# Patient Record
Sex: Male | Born: 1960 | Race: White | Hispanic: No | Marital: Single | Smoking: Current every day smoker
Health system: Southern US, Community
[De-identification: ages and names within clinical notes are randomized; demographics above are authoritative.]

## PROBLEM LIST (undated history)

## (undated) DIAGNOSIS — D649 Anemia, unspecified: Secondary | ICD-10-CM

## (undated) DIAGNOSIS — J449 Chronic obstructive pulmonary disease, unspecified: Secondary | ICD-10-CM

## (undated) DIAGNOSIS — I10 Essential (primary) hypertension: Secondary | ICD-10-CM

## (undated) DIAGNOSIS — N289 Disorder of kidney and ureter, unspecified: Secondary | ICD-10-CM

## (undated) DIAGNOSIS — K219 Gastro-esophageal reflux disease without esophagitis: Secondary | ICD-10-CM

## (undated) DIAGNOSIS — I2699 Other pulmonary embolism without acute cor pulmonale: Secondary | ICD-10-CM

## (undated) HISTORY — PX: OTHER SURGICAL HISTORY: SHX169

## (undated) HISTORY — DX: Gastro-esophageal reflux disease without esophagitis: K21.9

---

## 2007-03-30 ENCOUNTER — Ambulatory Visit: Payer: Self-pay | Admitting: Internal Medicine

## 2007-04-20 ENCOUNTER — Other Ambulatory Visit: Payer: Self-pay

## 2007-04-21 ENCOUNTER — Inpatient Hospital Stay: Payer: Self-pay | Admitting: Internal Medicine

## 2007-04-26 ENCOUNTER — Ambulatory Visit: Payer: Self-pay | Admitting: Internal Medicine

## 2007-04-30 ENCOUNTER — Ambulatory Visit: Payer: Self-pay | Admitting: Internal Medicine

## 2007-05-30 ENCOUNTER — Ambulatory Visit: Payer: Self-pay | Admitting: Internal Medicine

## 2007-06-30 ENCOUNTER — Ambulatory Visit: Payer: Self-pay | Admitting: Internal Medicine

## 2007-12-21 ENCOUNTER — Ambulatory Visit: Payer: Self-pay | Admitting: Nephrology

## 2008-02-01 ENCOUNTER — Ambulatory Visit: Payer: Self-pay | Admitting: Gastroenterology

## 2008-03-27 ENCOUNTER — Ambulatory Visit: Payer: Self-pay | Admitting: Gastroenterology

## 2010-05-12 ENCOUNTER — Emergency Department: Payer: Self-pay | Admitting: Emergency Medicine

## 2013-03-16 ENCOUNTER — Ambulatory Visit: Payer: Self-pay | Admitting: Adult Health

## 2013-04-11 ENCOUNTER — Ambulatory Visit: Payer: Self-pay

## 2013-04-18 ENCOUNTER — Ambulatory Visit: Payer: Self-pay

## 2014-01-03 LAB — LIPID PANEL
Cholesterol: 256 mg/dL — AB (ref 0–200)
HDL: 85 mg/dL — AB (ref 35–70)
LDL CALC: 141 mg/dL
TRIGLYCERIDES: 151 mg/dL (ref 40–160)

## 2014-06-27 LAB — TSH: TSH: 0.85 u[IU]/mL (ref 0.41–5.90)

## 2014-06-27 LAB — HEMOGLOBIN A1C: HEMOGLOBIN A1C: 5.6

## 2014-07-12 ENCOUNTER — Ambulatory Visit: Payer: Self-pay | Admitting: Internal Medicine

## 2014-12-17 IMAGING — US US RENAL KIDNEY
1 series · 14 of 25 positions shown · non-contrast
Comparison: none

REASON FOR EXAM: hist Nephrotic syndrome eval for size and shape
COMMENTS:

[Series 1: us renal kidney · 0.26mm/px · 14 of 53 slices shown]
[im 1/53]
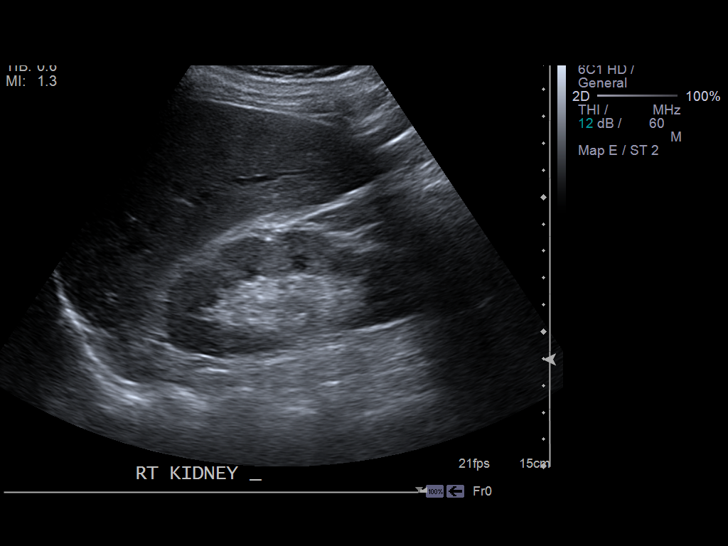
[im 5/53]
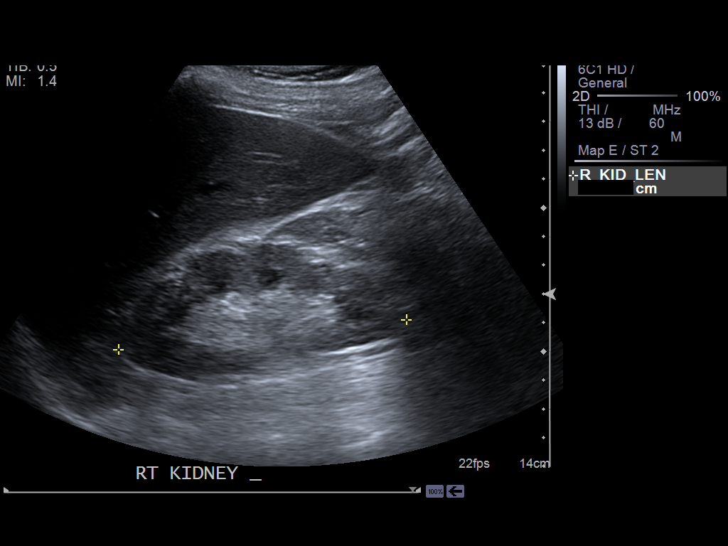
[im 9/53]
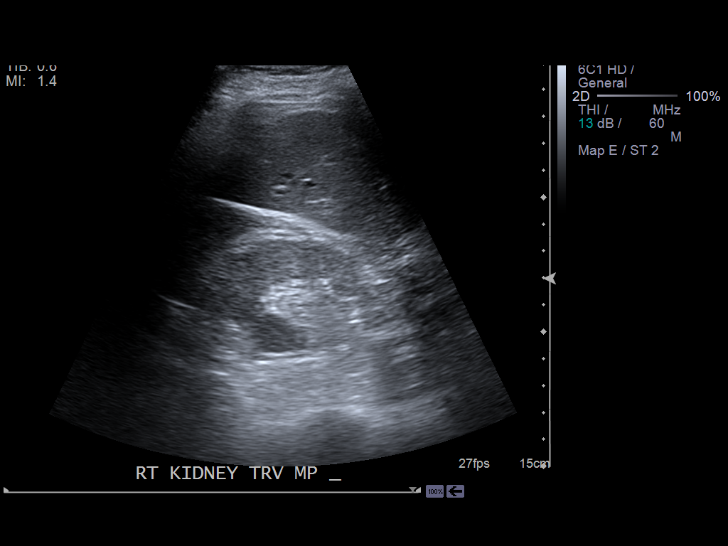
[im 14/53]
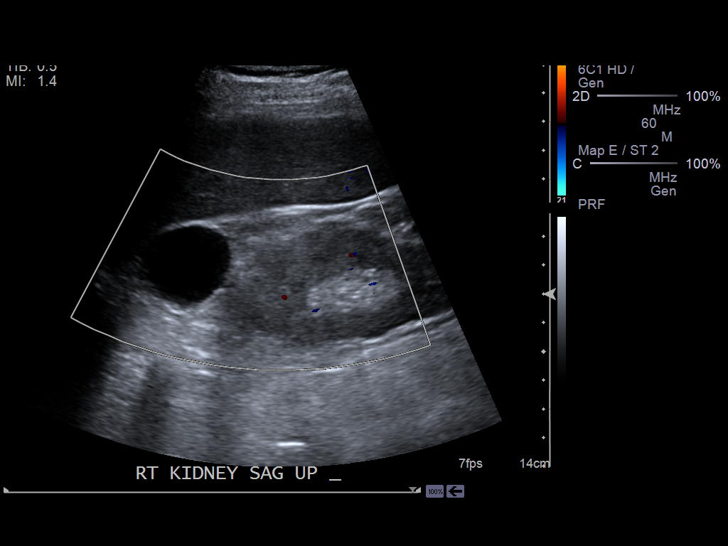
[im 18/53]
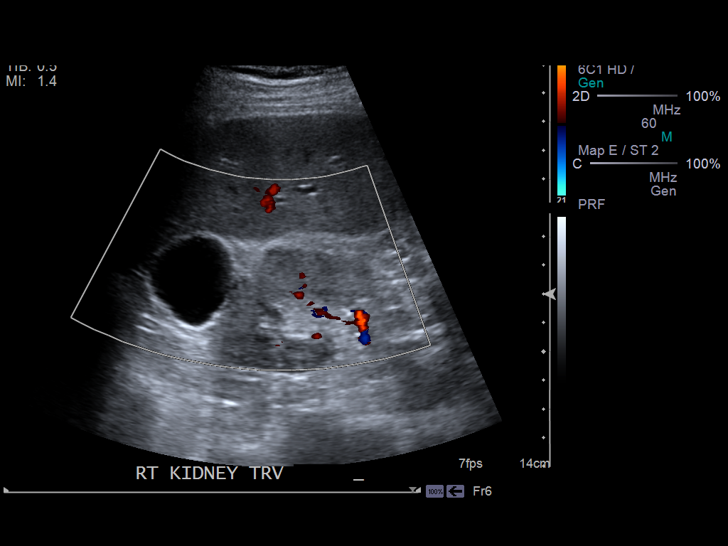
[im 20/53]
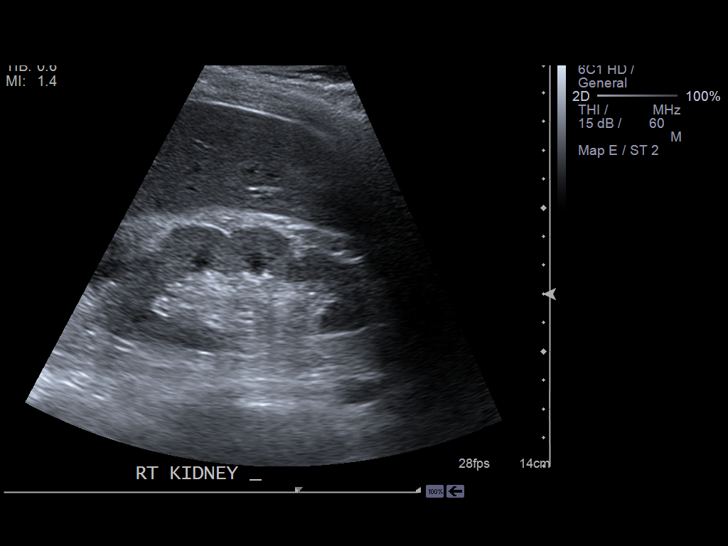
[im 24/53]
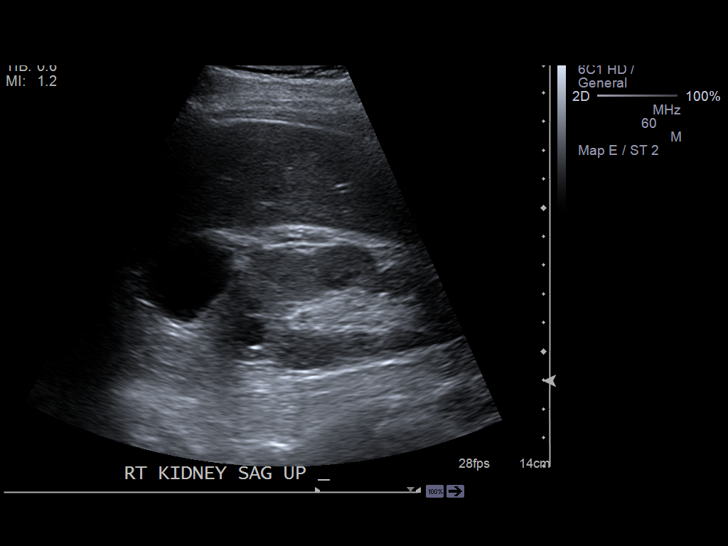
[im 29/53]
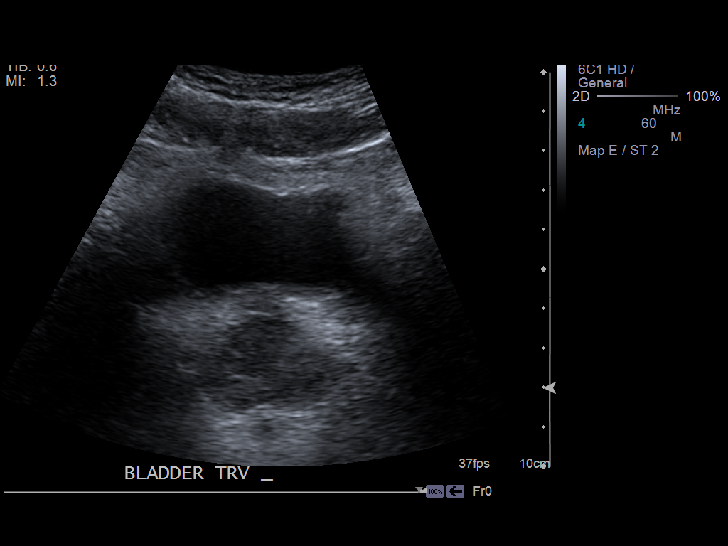
[im 33/53]
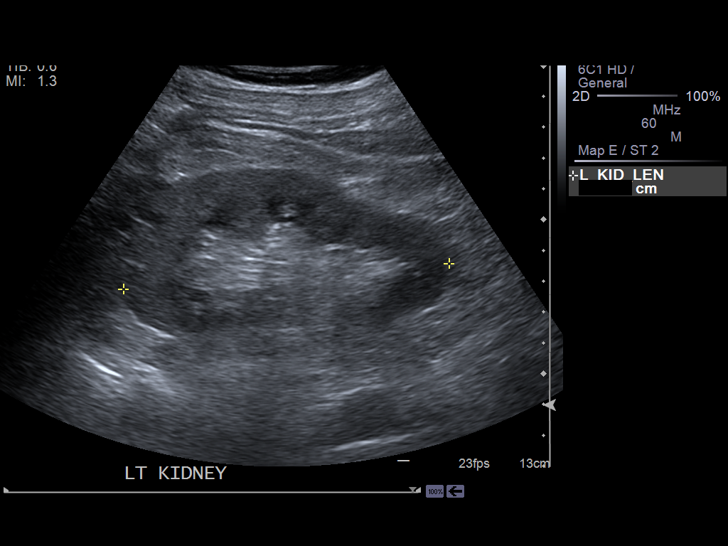
[im 35/53]
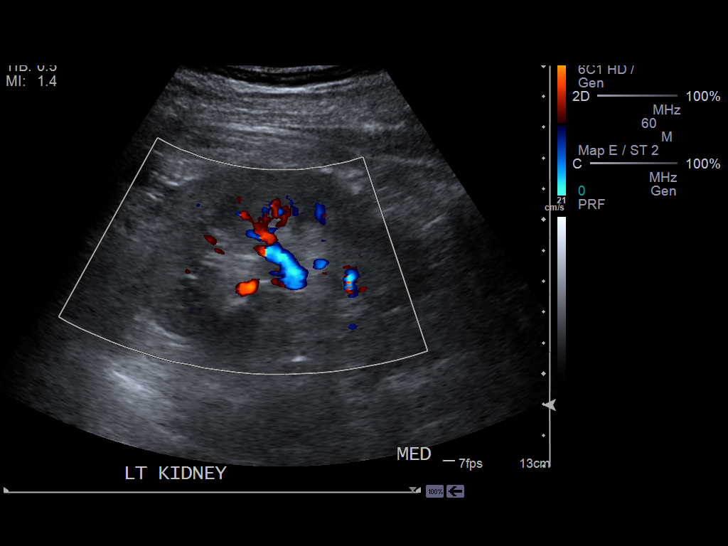
[im 40/53]
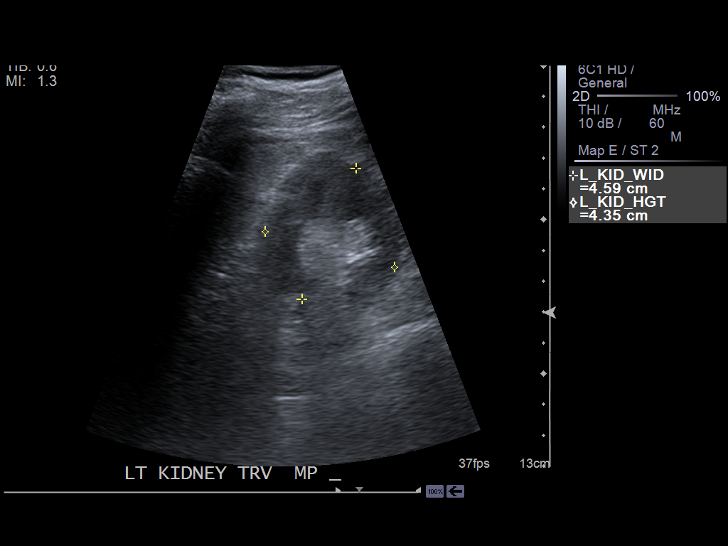
[im 44/53]
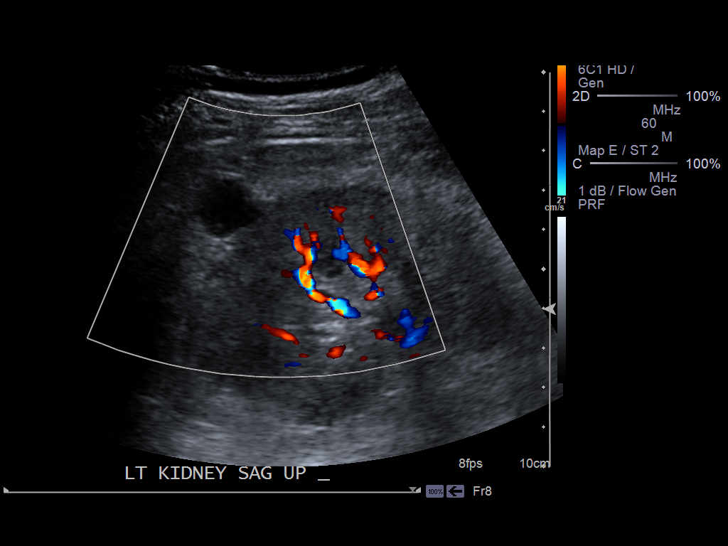
[im 48/53]
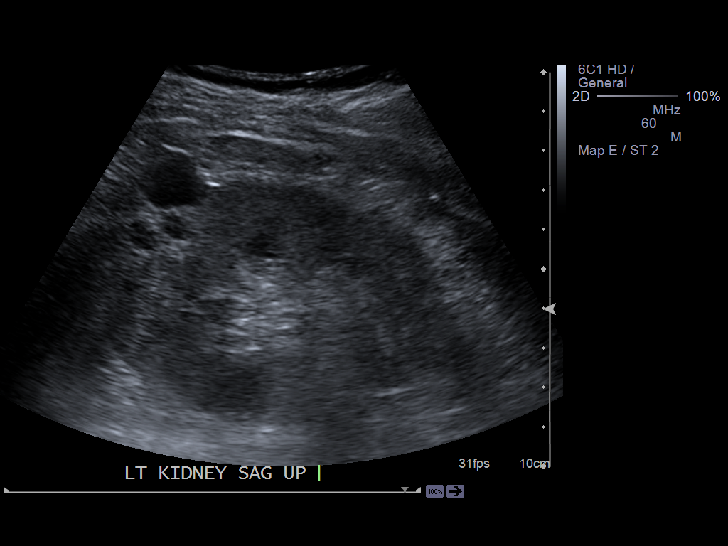
[im 53/53]
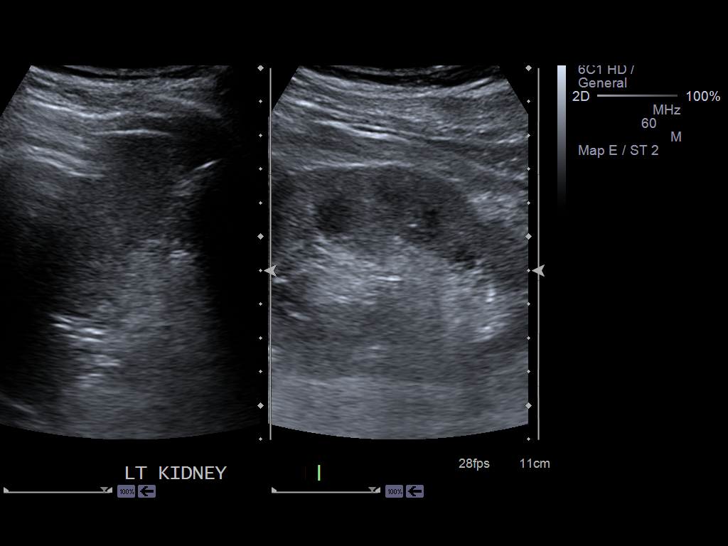

[14 of 25 positions shown; findings below may reference images not displayed]

PROCEDURE:     US  - US KIDNEY  - March 16, 2013 [DATE]

RESULT:     The kidneys are normal and contour. The right kidney measures
10.1 x 4.1 x 4.4 cm. The left kidney measures 10.6 x 4.6 x 4.4 cm. The
echotexture of the renal cortex on the right is slightly greater than that
of the adjacent liver suggesting medical renal disease. There is no
hydronephrosis. There are multiple cysts present. On the right the largest
cyst is associated with the upper pole and measures 3.6 x 2.7 x 3.1 cm. On
the left the largest cyst is also in the upper pole and measures 1.5 x 1.6 x
1.5 cm.

The urinary bladder is only partially distended.
IMPRESSION: 1. The kidneys appear normal in size and reniform in contour. There are
simple appearing cysts in both kidneys.
2. The echotexture the renal cortex on the right is slightly greater than
that of the adjacent liver consistent with medical renal disease.

[REDACTED]

## 2015-01-23 ENCOUNTER — Inpatient Hospital Stay
Admission: EM | Admit: 2015-01-23 | Discharge: 2015-01-24 | DRG: 683 | Disposition: A | Discharge status: Home / Self Care | Payer: Self-pay | Attending: Internal Medicine | Admitting: Internal Medicine

## 2015-01-23 ENCOUNTER — Inpatient Hospital Stay: Payer: Self-pay

## 2015-01-23 DIAGNOSIS — I10 Essential (primary) hypertension: Secondary | ICD-10-CM | POA: Diagnosis present

## 2015-01-23 DIAGNOSIS — R350 Frequency of micturition: Secondary | ICD-10-CM | POA: Diagnosis present

## 2015-01-23 DIAGNOSIS — Z86711 Personal history of pulmonary embolism: Secondary | ICD-10-CM

## 2015-01-23 DIAGNOSIS — Z8249 Family history of ischemic heart disease and other diseases of the circulatory system: Secondary | ICD-10-CM

## 2015-01-23 DIAGNOSIS — J449 Chronic obstructive pulmonary disease, unspecified: Secondary | ICD-10-CM | POA: Diagnosis present

## 2015-01-23 DIAGNOSIS — Z801 Family history of malignant neoplasm of trachea, bronchus and lung: Secondary | ICD-10-CM

## 2015-01-23 DIAGNOSIS — Z886 Allergy status to analgesic agent status: Secondary | ICD-10-CM

## 2015-01-23 DIAGNOSIS — N189 Chronic kidney disease, unspecified: Secondary | ICD-10-CM

## 2015-01-23 DIAGNOSIS — N2581 Secondary hyperparathyroidism of renal origin: Secondary | ICD-10-CM | POA: Diagnosis present

## 2015-01-23 DIAGNOSIS — R319 Hematuria, unspecified: Secondary | ICD-10-CM | POA: Diagnosis present

## 2015-01-23 DIAGNOSIS — D631 Anemia in chronic kidney disease: Secondary | ICD-10-CM | POA: Diagnosis present

## 2015-01-23 DIAGNOSIS — R3912 Poor urinary stream: Secondary | ICD-10-CM | POA: Diagnosis present

## 2015-01-23 DIAGNOSIS — Z7901 Long term (current) use of anticoagulants: Secondary | ICD-10-CM

## 2015-01-23 DIAGNOSIS — F17210 Nicotine dependence, cigarettes, uncomplicated: Secondary | ICD-10-CM | POA: Diagnosis present

## 2015-01-23 DIAGNOSIS — N179 Acute kidney failure, unspecified: Principal | ICD-10-CM | POA: Diagnosis present

## 2015-01-23 DIAGNOSIS — Z86718 Personal history of other venous thrombosis and embolism: Secondary | ICD-10-CM

## 2015-01-23 DIAGNOSIS — N186 End stage renal disease: Secondary | ICD-10-CM | POA: Diagnosis present

## 2015-01-23 DIAGNOSIS — H1132 Conjunctival hemorrhage, left eye: Secondary | ICD-10-CM | POA: Diagnosis present

## 2015-01-23 DIAGNOSIS — Z87441 Personal history of nephrotic syndrome: Secondary | ICD-10-CM

## 2015-01-23 DIAGNOSIS — Z79899 Other long term (current) drug therapy: Secondary | ICD-10-CM

## 2015-01-23 DIAGNOSIS — R809 Proteinuria, unspecified: Secondary | ICD-10-CM | POA: Diagnosis present

## 2015-01-23 DIAGNOSIS — I120 Hypertensive chronic kidney disease with stage 5 chronic kidney disease or end stage renal disease: Secondary | ICD-10-CM | POA: Diagnosis present

## 2015-01-23 HISTORY — DX: Anemia, unspecified: D64.9

## 2015-01-23 HISTORY — DX: Chronic obstructive pulmonary disease, unspecified: J44.9

## 2015-01-23 HISTORY — DX: Essential (primary) hypertension: I10

## 2015-01-23 HISTORY — DX: Other pulmonary embolism without acute cor pulmonale: I26.99

## 2015-01-23 HISTORY — DX: Disorder of kidney and ureter, unspecified: N28.9

## 2015-01-23 LAB — CBC
HCT: 26.8 % — ABNORMAL LOW (ref 40.0–52.0)
Hemoglobin: 9 g/dL — ABNORMAL LOW (ref 13.0–18.0)
MCH: 36.3 pg — ABNORMAL HIGH (ref 26.0–34.0)
MCHC: 33.8 g/dL (ref 32.0–36.0)
MCV: 107.4 fL — ABNORMAL HIGH (ref 80.0–100.0)
PLATELETS: 230 10*3/uL (ref 150–440)
RBC: 2.49 MIL/uL — AB (ref 4.40–5.90)
RDW: 13.6 % (ref 11.5–14.5)
WBC: 5.7 10*3/uL (ref 3.8–10.6)

## 2015-01-23 LAB — HEPATIC FUNCTION PANEL
ALK PHOS: 54 U/L (ref 38–126)
ALT: 12 U/L — ABNORMAL LOW (ref 17–63)
AST: 19 U/L (ref 15–41)
Albumin: 4.1 g/dL (ref 3.5–5.0)
Bilirubin, Direct: <0.1 mg/dL — ABNORMAL LOW (ref 0.1–0.5)
Total Bilirubin: 0.3 mg/dL (ref 0.3–1.2)
Total Protein: 7.5 g/dL (ref 6.5–8.1)

## 2015-01-23 LAB — URINALYSIS COMPLETE WITH MICROSCOPIC (ARMC ONLY)
BILIRUBIN URINE: NEGATIVE
Glucose, UA: NEGATIVE mg/dL
Ketones, ur: NEGATIVE mg/dL
Leukocytes, UA: NEGATIVE
NITRITE: NEGATIVE
PH: 5 (ref 5.0–8.0)
Protein, ur: 100 mg/dL — AB
SPECIFIC GRAVITY, URINE: 1.013 (ref 1.005–1.030)

## 2015-01-23 LAB — BASIC METABOLIC PANEL
Anion gap: 10 (ref 5–15)
BUN: 59 mg/dL — AB (ref 6–20)
CO2: 18 mmol/L — ABNORMAL LOW (ref 22–32)
CREATININE: 4.55 mg/dL — AB (ref 0.61–1.24)
Calcium: 8.8 mg/dL — ABNORMAL LOW (ref 8.9–10.3)
Chloride: 107 mmol/L (ref 101–111)
GFR calc Af Amer: 16 mL/min — ABNORMAL LOW (ref >60)
GFR, EST NON AFRICAN AMERICAN: 13 mL/min — AB (ref >60)
Glucose, Bld: 99 mg/dL (ref 65–99)
POTASSIUM: 4.6 mmol/L (ref 3.5–5.1)
Sodium: 135 mmol/L (ref 135–145)

## 2015-01-23 LAB — PROTIME-INR
INR: 2.26
Prothrombin Time: 25.1 seconds — ABNORMAL HIGH (ref 11.4–15.0)

## 2015-01-23 LAB — RENAL FUNCTION PANEL
Albumin: 3.8 g/dL (ref 3.5–5.0)
Anion gap: 7 (ref 5–15)
BUN: 51 mg/dL — AB (ref 6–20)
CHLORIDE: 108 mmol/L (ref 101–111)
CO2: 21 mmol/L — AB (ref 22–32)
CREATININE: 3.24 mg/dL — AB (ref 0.61–1.24)
Calcium: 8.8 mg/dL — ABNORMAL LOW (ref 8.9–10.3)
GFR calc Af Amer: 24 mL/min — ABNORMAL LOW (ref >60)
GFR calc non Af Amer: 20 mL/min — ABNORMAL LOW (ref >60)
GLUCOSE: 165 mg/dL — AB (ref 65–99)
POTASSIUM: 4.8 mmol/L (ref 3.5–5.1)
Phosphorus: 4.9 mg/dL — ABNORMAL HIGH (ref 2.5–4.6)
Sodium: 136 mmol/L (ref 135–145)

## 2015-01-23 LAB — PROTEIN / CREATININE RATIO, URINE
CREATININE, URINE: 135 mg/dL
PROTEIN CREATININE RATIO: 0.84 mg/mg{creat} — AB (ref 0.00–0.15)
TOTAL PROTEIN, URINE: 113 mg/dL

## 2015-01-23 MED ORDER — MORPHINE SULFATE 2 MG/ML IJ SOLN
2.0000 mg | INTRAMUSCULAR | Status: DC | PRN
  Administered 2015-01-23 – 2015-01-24 (×6): 2 mg via INTRAVENOUS
  Filled 2015-01-23 (×6): qty 1

## 2015-01-23 MED ORDER — ACETAMINOPHEN 325 MG PO TABS
650.0000 mg | ORAL_TABLET | Freq: Four times a day (QID) | ORAL | Status: DC | PRN
  Filled 2015-01-23: qty 2

## 2015-01-23 MED ORDER — MORPHINE SULFATE 2 MG/ML IJ SOLN
2.0000 mg | Freq: Once | INTRAMUSCULAR | Status: AC
  Administered 2015-01-23: 2 mg via INTRAVENOUS
  Filled 2015-01-23: qty 1

## 2015-01-23 MED ORDER — LISINOPRIL 10 MG PO TABS
10.0000 mg | ORAL_TABLET | Freq: Every day | ORAL | Status: DC
  Administered 2015-01-23: 10 mg via ORAL
  Filled 2015-01-23: qty 1

## 2015-01-23 MED ORDER — ACETAMINOPHEN 650 MG RE SUPP: 650.0000 mg | Freq: Four times a day (QID) | RECTAL | Status: DC | PRN

## 2015-01-23 MED ORDER — ONDANSETRON HCL 4 MG PO TABS
4.0000 mg | ORAL_TABLET | Freq: Four times a day (QID) | ORAL | Status: DC | PRN
Start: 2015-01-23 — End: 2015-01-24
  Administered 2015-01-24: 02:00:00 4 mg via ORAL
  Filled 2015-01-23: qty 1

## 2015-01-23 MED ORDER — TETRACAINE HCL 0.5 % OP SOLN
OPHTHALMIC | Status: AC
  Administered 2015-01-23: 01:00:00
  Filled 2015-01-23: qty 2

## 2015-01-23 MED ORDER — MORPHINE SULFATE 2 MG/ML IJ SOLN
INTRAMUSCULAR | Status: AC
  Administered 2015-01-23: 2 mg via INTRAVENOUS
  Filled 2015-01-23: qty 1

## 2015-01-23 MED ORDER — MORPHINE SULFATE 4 MG/ML IJ SOLN
6.0000 mg | Freq: Once | INTRAMUSCULAR | Status: AC
  Administered 2015-01-23: 4 mg via INTRAVENOUS
  Filled 2015-01-23: qty 2

## 2015-01-23 MED ORDER — ARTIFICIAL TEARS OP OINT
TOPICAL_OINTMENT | Freq: Every evening | OPHTHALMIC | Status: DC | PRN
Start: 2015-01-23 — End: 2015-01-24

## 2015-01-23 MED ORDER — WARFARIN SODIUM 5 MG PO TABS: 5.0000 mg | ORAL_TABLET | Freq: Every day | ORAL | Status: DC

## 2015-01-23 MED ORDER — TAMSULOSIN HCL 0.4 MG PO CAPS
0.4000 mg | ORAL_CAPSULE | Freq: Every day | ORAL | Status: DC
  Administered 2015-01-23 – 2015-01-24 (×2): 0.4 mg via ORAL
  Filled 2015-01-23 (×2): qty 1

## 2015-01-23 MED ORDER — FLUORESCEIN SODIUM 1 MG OP STRP
ORAL_STRIP | OPHTHALMIC | Status: AC
  Administered 2015-01-23: 01:00:00
  Filled 2015-01-23: qty 1

## 2015-01-23 MED ORDER — NICOTINE 10 MG IN INHA
1.0000 | RESPIRATORY_TRACT | Status: DC | PRN
Start: 2015-01-23 — End: 2015-01-24
  Administered 2015-01-23 – 2015-01-24 (×2): 1 via RESPIRATORY_TRACT
  Filled 2015-01-23: qty 36

## 2015-01-23 MED ORDER — ONDANSETRON HCL 4 MG/2ML IJ SOLN: 4.0000 mg | Freq: Four times a day (QID) | INTRAMUSCULAR | Status: DC | PRN

## 2015-01-23 MED ORDER — MORPHINE SULFATE 2 MG/ML IJ SOLN
INTRAMUSCULAR | Status: AC
  Filled 2015-01-23: qty 1

## 2015-01-23 MED ORDER — SODIUM CHLORIDE 0.9 % IV SOLN
INTRAVENOUS | Status: AC
  Administered 2015-01-23 – 2015-01-24 (×3): via INTRAVENOUS

## 2015-01-23 MED ORDER — MORPHINE SULFATE 2 MG/ML IJ SOLN
2.0000 mg | Freq: Once | INTRAMUSCULAR | Status: AC
Start: 2015-01-23 — End: 2015-01-23
  Administered 2015-01-23: 11:00:00 2 mg via INTRAVENOUS
  Filled 2015-01-23: qty 1

## 2015-01-23 NOTE — Plan of Care (Signed)
Problem: Discharge Progression Outcomes Goal: Other Discharge Outcomes/Goals Outcome: Progressing Patient c/o right eye pain several times this shift, relieved by prn Morphine   VSS [place] of Kidneys done today and both Nephrology and Optomology consults were completed Additional labs ordered for tomorrow   Patients appetite is fair he was nauseating once today and has some emesis but felt better after and declined nausea meds Possible d/c to home

## 2015-01-23 NOTE — Consult Note (Signed)
[facility]  CONSULT NOTE    Date: 01/23/2015                  Patient Name:  Joseph Kramer  MRN: [id number]  DOB: [birth year]  Age / Sex: 54 y.o., male         PCP: No primary care provider on file.                 Service Requesting Consult: Dr. Anselm Jungling                 Reason for Consult: Membranous Glomerulopathy, chronic kidney disease stage V            History of Present Illness: Mr. Joseph Kramer is a 54 y.o. 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  Patient states he was diagnosed with membranous glomerulopathy in 2008 by a DVT at [facility]. Patient was then treated with prednisone. He states he did not want to take cyclophosphamide. 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 However patient has not seen his primary nephrologist, Dr. Johnney Ou, in 2014.  Patient states he has had several lapses in care due to insurance. Currently he is going to [facility] in [place] and seeing Dr. Gwynne Edinger.  Patient came to the ED last night due to conjunctival hemorrhage. He was found to have a serum creatinine of 4.55 and eGFR of 13. No baseline labs are available.  He states that he has been having nausea and vomiting for the last few days. He has been fatigued and tired. He states it has been harder for him to urinate.    Medications: Outpatient medications: Prescriptions prior to admission  Medication Sig Dispense Refill Last Dose  . hydrochlorothiazide (MICROZIDE) 12.5 MG capsule Take 25 mg by mouth daily.    01/22/2015 at Unknown time  . lisinopril (PRINIVIL,ZESTRIL) 10 MG tablet Take 10 mg by mouth daily.   01/22/2015 at Unknown time  . warfarin (COUMADIN) 5 MG tablet Take 5 mg by mouth daily.   01/22/2015 at Unknown time    Current medications: Current Facility-Administered Medications   Medication Dose Route Frequency Provider Last Rate Last Dose  . 0.9 %  sodium chloride infusion   Intravenous Continuous Juluis Mire, MD 100 mL/hr at 01/23/15 0400    . acetaminophen (TYLENOL) tablet 650 mg  650 mg Oral Q6H PRN Juluis Mire, MD       Or  . acetaminophen (TYLENOL) suppository 650 mg  650 mg Rectal Q6H PRN Juluis Mire, MD      . artificial tears (LACRILUBE) ophthalmic ointment   Both Eyes QHS PRN Vaughan Basta, MD      . morphine 2 MG/ML injection 2 mg  2 mg Intravenous Q4H PRN Vaughan Basta, MD   2 mg at 01/23/15 1305  . ondansetron (ZOFRAN) tablet 4 mg  4 mg Oral Q6H PRN Juluis Mire, MD       Or  . ondansetron [facility]) injection 4 mg  4 mg Intravenous Q6H PRN Juluis Mire, MD          Allergies: Allergies  Allergen Reactions  . Codeine Rash      Past Medical History: Past Medical History  Diagnosis Date  . Hypertension   . COPD (chronic obstructive pulmonary disease)   . Renal disorder   .  Anemia   . PE (pulmonary embolism)      Past Surgical History: Past Surgical History  Procedure Laterality Date  .  collapsed lung       Family History: Family History  Problem Relation Age of Onset  . Hypertension Mother   . Lung cancer Father      Social History: History   Social History  . Marital Status: Single    Spouse Name: N/A  . Number of Children: N/A  . Years of Education: N/A   Occupational History  . Not on file.   Social History Main Topics  . Smoking status: Current Every Day Smoker -- 1.00 packs/day for 35 years  . Smokeless tobacco: Not on file  . Alcohol Use: 29.4 oz/week    28 Cans of beer, 21 Shots of liquor, 0 Glasses of wine per week  . Drug Use: No  . Sexual Activity: Yes    Birth Control/ Protection: Condom   Other Topics Concern  . Not on file   Social History Narrative  . No narrative on file     Review of Systems: ROS  Vital Signs: Blood pressure 134/74, pulse 59,  temperature 97.5 F (36.4 C), temperature source Oral, resp. rate 20, height _0  (1.702 m), weight 54.84 kg (120 lb 14.4 oz), SpO2 99 %.  Weight trends: Filed Weights   01/23/15 0102 01/23/15 0411  Weight: 59.875 kg (132 lb) 54.84 kg (120 lb 14.4 oz)    Physical Exam: General: NAD,   Head: Normocephalic, atraumatic. Moist oral mucosal membranes  Eyes: Anicteric, PERRL  Neck: Supple, trachea midline  Lungs:  Clear to auscultation  Heart: Regular rate and rhythm  Abdomen:  Soft, nontender,   Extremities:  peripheral edema.  Neurologic: Nonfocal, moving all four extremities  Skin: No lesions        Lab results: Basic Metabolic Panel:  Recent Labs Lab 01/23/15 0122  NA 135  K 4.6  CL 107  CO2 18*  GLUCOSE 99  BUN 59*  CREATININE 4.55*  CALCIUM 8.8*    Liver Function Tests:  Recent Labs Lab 01/23/15 0122  AST 19  ALT 12*  ALKPHOS 54  BILITOT 0.3  PROT 7.5  ALBUMIN 4.1   No results for input(s): LIPASE, AMYLASE in the last 168 hours. No results for input(s): AMMONIA in the last 168 hours.  CBC:  Recent Labs Lab 01/23/15 0122  WBC 5.7  HGB 9.0*  HCT 26.8*  MCV 107.4*  PLT 230    Cardiac Enzymes: No results for input(s): CKTOTAL, CKMB, CKMBINDEX, TROPONINI in the last 168 hours.  BNP: Invalid input(s): POCBNP  CBG: No results for input(s): GLUCAP in the last 168 hours.  Microbiology: No results found for this or any previous visit.  Coagulation Studies:  Recent Labs  01/23/15 0122  LABPROT 25.1*  INR 2.26    Urinalysis:  Recent Labs  01/23/15 0337  COLORURINE YELLOW*  LABSPEC 1.013  PHURINE 5.0  GLUCOSEU NEGATIVE  HGBUR 1+*  BILIRUBINUR NEGATIVE  KETONESUR NEGATIVE  PROTEINUR 100*  NITRITE NEGATIVE  LEUKOCYTESUR NEGATIVE      Imaging: US Renal  01/23/2015   CLINICAL DATA:  Acute kidney failure  EXAM: RENAL / URINARY TRACT ULTRASOUND COMPLETE  COMPARISON:  03/16/2013  FINDINGS: Right Kidney:  Length: 10.1 cm.  Echogenicity within normal limits. No mass or hydronephrosis visualized. Mild cortical thinning. Upper pole simple cyst measures 3.6 x 3.1 x 2.8 cm and previously measured 3.6 x 3.1 x 2.7 cm.  Adjacent cyst measures 1.2 x 1.1 x 0.9 cm and previously measured 0.9 x 1.0 x 1.0 cm. These are simple cysts.  Left Kidney:  Length: 10.6 cm. Echogenicity within normal limits. No mass or hydronephrosis visualized. Mild cortical thinning. Lateral mid kidney cyst measures 1.5 x 1.6 x 1.7 cm. Previously, this measured 1.6 x 1.5 x 1.5 cm.  Bladder:  Appears normal for degree of bladder distention.  IMPRESSION: No acute urinary pathology.  Stable simple cysts bilaterally.   Electronically Signed   By: Marybelle Killings M.D.   On: 01/23/2015 09:40      Assessment & Plan: Mr. MACRAE WIEGMAN is a 54 y.o.  male with ,with membranous glomerulopathy biopsy proven with history of nephrotic syndrome, DVT on warfarin, hypertension who was admitted to [facility] on 01/23/2015 for Acute-on-chronic kidney injury.   1. 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  - At this time, full vasculitis work up has been sent. However most likely this is due to progression of membranous gn. He is now at chronic kidney disease stage V with uremic symptoms. Will start planning for outpatient dialysis.  - Currently on IVF  2. Hypertension: well controlled. Currently not on any agents. History of lisinopril.   3. Secondary Hyperparathyroidism: PTH and phos pending. Calcium at goal.   LOS: 0 Aaryn Sermon 7/27/20161:53 PM

## 2015-01-23 NOTE — Consult Note (Signed)
CC: red left eye HPI: 54 yo WM admitted w renal failure, currently on warfarin woke up with left eye red and mild discomfort.  No change in vision. Pain is irritation. No headache, no neuro symptoms, no change in vision.   PMH: renal failure, HTN, PE/DVT- on coumadin, anemia  POcHx: does not wear glasses  Social Hx: N/a Fam Hx: n/a  Exam:   Va [place] near OD 20/50  OS 20/50  IOP : STP OU Pupils: No apd  Lids/external wnl, no ptosis  Conj/sclera: white and quiet OD, OS subconjunctival hemorrhage  Cornea: clear OU AC: deep and quiet OU  Not dilated  A/P:  1. Subconjunctival hemorrhage OS:  Discussed with patient bruise on eye.  Will resolve with time like a bruise elsewhere.  Likely related to anticoagulation + HTN.    - artificial tears as needed for comfort  - follow up for complete eye exam when discharged with patients own ophthalmologist or with [place] if needed.  Marcelene Butte MD Vitreoretinal surgery and disease [facility]

## 2015-01-23 NOTE — Progress Notes (Signed)
Initial Nutrition Assessment       INTERVENTION:   Meals and snacks: Cater to pt preferences   NUTRITION DIAGNOSIS:   Inadequate oral intake related to acute illness as evidenced by meal completion < 25%, other (see comment) (vomiting this am following breakfast).    GOAL:   Patient will meet greater than or equal to 90% of their needs    MONITOR:    (Energy intake, Digestive system, Electrolyte and renal profile)  REASON FOR ASSESSMENT:   Diagnosis    ASSESSMENT:   Pt admitted with subconjunctival hemorrhage and acute kidney injury  Past Medical History  Diagnosis Date  . Hypertension   . COPD (chronic obstructive pulmonary disease)   . Renal disorder   . Anemia   . PE (pulmonary embolism)     Current Nutrition: ate few bites of eggs and toast this am but reports vomiting following breakfast.  Not feeling well and in pain this am effecting appetite  Food/Nutrition-Related History: Pt reports fairly normal appetite prior to admission   Medications: NS at 177ml/hr  Electrolyte/Renal Profile and Glucose Profile:   Recent Labs Lab 01/23/15 0122  NA 135  K 4.6  CL 107  CO2 18*  BUN 59*  CREATININE 4.55*  CALCIUM 8.8*  GLUCOSE 99     Last BM:7/26    Nutrition-Focused Physical Exam Findings:  Unable to complete Nutrition-Focused physical exam at this time.     Weight Change: pt reports stable weight prior to admission    Diet Order:  Diet Heart Room service appropriate?: Yes; Fluid consistency:: Thin  Skin:  Reviewed, no issues   Height:   Ht Readings from Last 1 Encounters:  01/23/15  (1.702 m)    Weight:   Wt Readings from Last 1 Encounters:  01/23/15 120 lb 14.4 oz (54.84 kg)     BMI:  Body mass index is 18.93 kg/(m^2).   EDUCATION NEEDS:   No education needs identified at this time  LOW Care Level  Nakia Remmers B. Freida Busman, RD, LDN [phone] (pager)

## 2015-01-23 NOTE — Progress Notes (Signed)
[facility] -  at [facility]   PATIENT NAME: Joseph Kramer    MR#:  [id number]  DATE OF BIRTH:  1961-01-28  SUBJECTIVE:  CHIEF COMPLAINT:   Chief Complaint  Patient presents with  . Foreign Body in Eye    Have c/o severe pain in left eye.  REVIEW OF SYSTEMS:  CONSTITUTIONAL: No fever, fatigue or weakness.  EYES: No blurred or double vision. Severe pain left eye, but no change in vision.    He said- he never felt any foreign body went in his eye- was wearing protective goggles while working all the time. EARS, NOSE, AND THROAT: No tinnitus or ear pain.  RESPIRATORY: No cough, shortness of breath, wheezing or hemoptysis.  CARDIOVASCULAR: No chest pain, orthopnea, edema.  GASTROINTESTINAL: No nausea, vomiting, diarrhea or abdominal pain.  GENITOURINARY: No dysuria, hematuria.  ENDOCRINE: No polyuria, nocturia,  HEMATOLOGY: No anemia, easy bruising or bleeding SKIN: No rash or lesion. MUSCULOSKELETAL: No joint pain or arthritis.   NEUROLOGIC: No tingling, numbness, weakness.  PSYCHIATRY: No anxiety or depression.   ROS  DRUG ALLERGIES:   Allergies  Allergen Reactions  . Codeine Rash    VITALS:  Blood pressure 141/81, pulse 65, temperature 96.9 F (36.1 C), temperature source Axillary, resp. rate 20, height 5\' 7"  (1.702 m), weight 54.84 kg (120 lb 14.4 oz), SpO2 100 %.  PHYSICAL EXAMINATION:  GENERAL:  54 y.o.-year-old patient lying in the bed with no acute distress.  EYES: Pupils equal, round, reactive to light and accommodation. Left side- subconjunctival hemorrhage with bulging conjunctiva. Extraocular muscles intact.  HEENT: Head atraumatic, normocephalic. Oropharynx and nasopharynx clear.  NECK:  Supple, no jugular venous distention. No thyroid enlargement, no tenderness.  LUNGS: Normal breath sounds bilaterally, no wheezing, rales,rhonchi or crepitation. No use of accessory muscles of respiration.  CARDIOVASCULAR: S1, S2 normal. No  murmurs, rubs, or gallops.  ABDOMEN: Soft, nontender, nondistended. Bowel sounds present. No organomegaly or mass.  EXTREMITIES: No pedal edema, cyanosis, or clubbing.  NEUROLOGIC: Cranial nerves II through XII are intact. Muscle strength 5/5 in all extremities. Sensation intact. Gait not checked.  PSYCHIATRIC: The patient is alert and oriented x 3.  SKIN: No obvious rash, lesion, or ulcer.   Physical Exam LABORATORY PANEL:   CBC  Recent Labs Lab 01/23/15 0122  WBC 5.7  HGB 9.0*  HCT 26.8*  PLT 230   ------------------------------------------------------------------------------------------------------------------  Chemistries   Recent Labs Lab 01/23/15 0122  NA 135  K 4.6  CL 107  CO2 18*  GLUCOSE 99  BUN 59*  CREATININE 4.55*  CALCIUM 8.8*   ------------------------------------------------------------------------------------------------------------------  Cardiac Enzymes No results for input(s): TROPONINI in the last 168 hours. ------------------------------------------------------------------------------------------------------------------  RADIOLOGY:  US Renal  01/23/2015   CLINICAL DATA:  Acute kidney failure  EXAM: RENAL / URINARY TRACT ULTRASOUND COMPLETE  COMPARISON:  03/16/2013  FINDINGS: Right Kidney:  Length: 10.1 cm. Echogenicity within normal limits. No mass or hydronephrosis visualized. Mild cortical thinning. Upper pole simple cyst measures 3.6 x 3.1 x 2.8 cm and previously measured 3.6 x 3.1 x 2.7 cm. Adjacent cyst measures 1.2 x 1.1 x 0.9 cm and previously measured 0.9 x 1.0 x 1.0 cm. These are simple cysts.  Left Kidney:  Length: 10.6 cm. Echogenicity within normal limits. No mass or hydronephrosis visualized. Mild cortical thinning. Lateral mid kidney cyst measures 1.5 x 1.6 x 1.7 cm. Previously, this measured 1.6 x 1.5 x 1.5 cm.  Bladder:  Appears normal for degree of bladder distention.  IMPRESSION: No acute urinary pathology.  Stable simple cysts  bilaterally.   Electronically Signed   By: Jolaine Click M.D.   On: 01/23/2015 09:40    ASSESSMENT AND PLAN:   * Ac on ch kidney failure   He has c/o urinary frequency, hasistancy, and weak stream for last few months and has to wake up every 1-2 hrs in night. Likely it is  A prostate problem.   i will start flomax, US renal is done- will wait for report- and may send him to urologist as out pt.   Nephrology consult is awaited.   Follow renal func., avoid nphrotoxins.  * Left side sub conjunctival hemorrhage   Pain in same eye. Vision not changed.   Ophthalmology consult is called in morning.   I called again to speak to ophthalmologist to get urgent consult. Waiting for response.   He is on coumadin.  * Hypertension, stable. Continue lisinopril, hold his HCTZ for now.  * History of pulmonary embolism/DVT, patient on Coumadin, INR therapeutic.    I encouraged him to follow with a hematologist , to determine- " if he need life long anticoagulation?"   It looks like he has nephrotic syndrome and as a result he had PE and then empyema in 2008.  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 Patient stable clinically. Monitor CBC.     All the records are reviewed and case discussed with Care Management/Social Workerr. Management plans discussed with the patient, family and they are in agreement.  CODE STATUS: full  TOTAL TIME TAKING CARE OF THIS PATIENT: 40 minutes.     POSSIBLE D/C IN 1-2 DAYS, DEPENDING ON CLINICAL CONDITION.   Altamese Dilling M.D on 01/23/2015   Between 7am to 6pm - Pager - [phone]  After 6pm go to [email or web address] - password [email or web address] [facility]  [place] [facility]  Office  [phone]  CC: Primary care physician; No primary care provider on file.

## 2015-01-23 NOTE — Plan of Care (Signed)
Problem: Discharge Progression Outcomes Goal: Other Discharge Outcomes/Goals Outcome: Progressing Plan of care progress to goal: Pain - morphine covering eye pain Hemodynamically stable - vitals stable Complications - pt instructed to avoid rubbing eye Activity - up ad lib

## 2015-01-23 NOTE — Care Management (Signed)
Admitted to [facility] with the diagnosis of acute/chronic kidney injury. Lives with mother, Joseph Kramer [phone]). Goes to the [employer].  Seen by Dr Candelaria Stagers at the  [facility] several months ago. Will update Lelon Mast at the clinic. Works at [employer] on and off for the last 4-5 years. Takes care of all instrumental and [place] activities of daily living himself, still drives.  Unsure as to who he can call for transportation when discharged. Gwenette Greet RN MSN Care Management [phone]

## 2015-01-23 NOTE — Plan of Care (Signed)
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 Current everyday smoker but declined smoking cessation information  Drinks beer and shots of liquor daily. Monitor for s/sx of withdrawal.

## 2015-01-23 NOTE — ED Notes (Addendum)
Visual Acuity - Bilateral Near: 20/50 R Near: 20/200 L Near: 20/100 Pt reports wearing reading glasses but doesn't have them with him now in the ED.

## 2015-01-23 NOTE — Progress Notes (Signed)
Paged Dr. Betti Cruz for pain meds.  Ordered Morphine  one time dose.

## 2015-01-23 NOTE — H&P (Signed)
[facility] - [place] at [facility]   PATIENT NAME: Dora Clauss    MR#:  [id number]  DATE OF BIRTH:  May 23, 1961  DATE OF ADMISSION:  01/23/2015  PRIMARY CARE PHYSICIAN: No primary care provider on file.   REQUESTING/REFERRING PHYSICIAN: Quale  CHIEF COMPLAINT:   Chief Complaint  Patient presents with  . Foreign Body in Eye   Left eye pain with redness HISTORY OF PRESENT ILLNESS:  Sohail Capraro  is a 54 y.o. male with a known history of hypertension, CK D stage III, history of pulmonary embolism/DVT on Coumadin, anemia of chronic kidney disease presents to the emergency room with the complaints of redness and pain/discomfort left eye noticed around 10 PM last night. Patient states he went to bed last night normally and when he  woke up he noticed redness, swelling and pain of left eye, hence came to the emergency room for further evaluation. Denies any injury to the eye. Denies any visual impairment denies any recent URI symptoms or chronic cough. Denies any chest pain, shortness of breath, nausea, vomiting, diarrhea, abdominal pain, dysuria. Evaluation in the ED revealed left eye significant subconjunctival hemorrhage with no visual impairment and blood work revealed acute kidney injury on chronic kidney disease stage III. Hospitalist service was consulted for further evaluation and management. Patient is comfortably resting in the bed at this time and denies any complaint except for discomfort in the left eye.  PAST MEDICAL HISTORY:   Past Medical History  Diagnosis Date  . Hypertension   . COPD (chronic obstructive pulmonary disease)   . Renal disorder   . Anemia   . PE (pulmonary embolism)     PAST SURGICAL HISTORY:   Past Surgical History  Procedure Laterality Date  .  collapsed lung      SOCIAL HISTORY:   History  Substance Use Topics  . Smoking status: Current Every Day Smoker  . Smokeless tobacco: Not on file  . Alcohol Use: Yes     FAMILY HISTORY:   Family History  Problem Relation Age of Onset  . Hypertension Mother   . Lung cancer Father     DRUG ALLERGIES:   Allergies  Allergen Reactions  . Codeine Rash    REVIEW OF SYSTEMS:   Review of Systems  Constitutional: Negative for fever, chills and malaise/fatigue.  HENT: Negative for ear pain, hearing loss, nosebleeds, sore throat and tinnitus.   Eyes: Positive for pain and redness. Negative for blurred vision, double vision and discharge.       Left eye pain, swelling and redness as noted in history of present illness.  Respiratory: Negative for cough, hemoptysis, sputum production, shortness of breath and wheezing.   Cardiovascular: Negative for chest pain, palpitations, orthopnea and leg swelling.  Gastrointestinal: Negative for nausea, vomiting, abdominal pain, diarrhea, constipation, blood in stool and melena.  Genitourinary: Negative for dysuria, urgency, frequency and hematuria.  Musculoskeletal: Negative for back pain, joint pain and neck pain.  Skin: Negative for itching and rash.  Neurological: Negative for dizziness, tingling, sensory change, focal weakness and seizures.  Endo/Heme/Allergies: Does not bruise/bleed easily.  Psychiatric/Behavioral: Negative for depression. The patient is not nervous/anxious.     MEDICATIONS AT HOME:   Prior to Admission medications   Medication Sig Start Date End Date Taking? Authorizing Provider  hydrochlorothiazide (MICROZIDE) 12.5 MG capsule Take 25 mg by mouth daily.    Yes Historical Provider, MD  lisinopril (PRINIVIL,ZESTRIL) 10 MG tablet Take 10 mg by mouth daily.  Yes Historical Provider, MD  warfarin (COUMADIN) 5 MG tablet Take 5 mg by mouth daily.   Yes Historical Provider, MD      VITAL SIGNS:  Blood pressure 128/72, pulse 73, temperature 97.6 F (36.4 C), temperature source Oral, resp. rate 16, height  (1.702 m), weight 59.875 kg (132 lb), SpO2 100 %.  PHYSICAL EXAMINATION:  Physical  Exam  Constitutional: He is oriented to person, place, and time. He appears well-developed and well-nourished. No distress.  HENT:  Head: Normocephalic and atraumatic.  Right Ear: External ear normal.  Left Ear: External ear normal.  Nose: Nose normal.  Mouth/Throat: Oropharynx is clear and moist. No oropharyngeal exudate.  Eyes: EOM are normal. Pupils are equal, round, and reactive to light. No scleral icterus.  Left eye subconjunctival hemorrhage with bleb +  Neck: Normal range of motion. Neck supple. No JVD present. No thyromegaly present.  Cardiovascular: Normal rate, regular rhythm, normal heart sounds and intact distal pulses.  Exam reveals no friction rub.   No murmur heard. Respiratory: Effort normal and breath sounds normal. No respiratory distress. He has no wheezes. He has no rales. He exhibits no tenderness.  GI: Soft. Bowel sounds are normal. He exhibits no distension and no mass. There is no tenderness. There is no rebound and no guarding.  Musculoskeletal: Normal range of motion. He exhibits no edema.  Lymphadenopathy:    He has no cervical adenopathy.  Neurological: He is alert and oriented to person, place, and time. He has normal reflexes. He displays normal reflexes. No cranial nerve deficit. He exhibits normal muscle tone.  Skin: Skin is warm. No rash noted. No erythema.  Psychiatric: He has a normal mood and affect. His behavior is normal. Thought content normal.   LABORATORY PANEL:   CBC  Recent Labs Lab 01/23/15 0122  WBC 5.7  HGB 9.0*  HCT 26.8*  PLT 230   ------------------------------------------------------------------------------------------------------------------  Chemistries   Recent Labs Lab 01/23/15 0122  NA 135  K 4.6  CL 107  CO2 18*  GLUCOSE 99  BUN 59*  CREATININE 4.55*  CALCIUM 8.8*   ------------------------------------------------------------------------------------------------------------------  Cardiac Enzymes No results  for input(s): TROPONINI in the last 168 hours. ------------------------------------------------------------------------------------------------------------------  RADIOLOGY:  No results found.  EKG:   Orders placed or performed during the hospital encounter of 01/23/15  . EKG 12-Lead  . EKG 12-Lead  Normal sinus rhythm with ventricular rate of 78 bpm, no acute ST-T changes.  IMPRESSION AND PLAN:   1. Acute kidney injury on CK D stage III. Plan: Admit to medicine, gentle IV hydration, hold HCTZ for now, obtain urinalysis with microscopy, renal ultrasound, monitor I&O's, avoid nephrotoxic agents, follow-up BMP. Nephrology consultation for further advice requested. 2. Left eye redness with discomfort secondary to subconjunctival hemorrhage. Denies any eye injury/foreign body sensation. Plan: Monitor, avoid rubbing or scratching of left eye. Ophthalmology consultation requested for further advice. 3. Hypertension, stable. Continue lisinopril, hold his HCTZ for now. 4. History of pulmonary embolism/DVT, patient on Coumadin, INR therapeutic. Continue same, follow-up pro times/INR. 5. Anemia, likely secondary to anemia of CK D. Patient stable clinically. Monitor CBC.    All the records are reviewed and case discussed with ED provider. Management plans discussed with the patient, family and they are in agreement.  CODE STATUS: Full code  TOTAL TIME TAKING CARE OF THIS PATIENT: 50 minutes.    Crissie Figures M.D on 01/23/2015 at 3:07 AM  Between 7am to 6pm - Pager - [phone]  After  6pm go to [email or web address] - password [email or web address] [facility]  [place] [facility]  Office  [phone]  CC: Primary care physician; No primary care provider on file.

## 2015-01-23 NOTE — ED Provider Notes (Signed)
[facility] Emergency Department Provider Note  ____________________________________________  Time seen: Approximately 2:18 AM  I have reviewed the triage vital signs and the nursing notes.   HISTORY  Chief Complaint Foreign Body in Eye    HPI Joseph Kramer is a 54 y.o. male resents today after noticing eye pain around 9 PM. He woke up and noticed that his left eye had bleeding in the corner and was swollen. 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 He went home and several hours later noticed this in his left eye with pain. He does relate that he is on Coumadin.  He has not noticed any changes in his vision. There is no clouding in the left eye. There is no pain in any Central eye only in the periphery on the left where the bleeding was noted.  He does wear glasses which she does not have on today.  Past Medical History  Diagnosis Date  . Hypertension   . COPD (chronic obstructive pulmonary disease)   . Renal disorder     There are no active problems to display for this patient.   Past Surgical History  Procedure Laterality Date  .  collapsed lung      Current Outpatient Rx  Name  Route  Sig  Dispense  Refill  . hydrochlorothiazide (MICROZIDE) 12.5 MG capsule   Oral   Take 25 mg by mouth daily.          Marland Kitchen lisinopril (PRINIVIL,ZESTRIL) 10 MG tablet   Oral   Take 10 mg by mouth daily.         Marland Kitchen warfarin (COUMADIN) 5 MG tablet   Oral   Take 5 mg by mouth daily.           Allergies Codeine  No family history on file.  Social History History  Substance Use Topics  . Smoking status: Current Every Day Smoker  . Smokeless tobacco: Not on file  . Alcohol Use: Yes    Review of Systems Constitutional: No fever/chills Eyes: No visual changes. See  history of present illness the ENT: No sore throat. Cardiovascular: Denies chest pain. Respiratory: Denies shortness of breath. Gastrointestinal: No abdominal pain.  No nausea, no vomiting.  No diarrhea.  No constipation. Genitourinary: Negative for dysuria. He notes he is not urinating as much as he usually has the past. Musculoskeletal: Negative for back pain. Skin: Negative for rash. Neurological: Negative for headaches, focal weakness or numbness.  10-point ROS otherwise negative.  ____________________________________________   PHYSICAL EXAM:  VITAL SIGNS: ED Triage Vitals  Enc Vitals Group     BP 01/23/15 0102 132/86 mmHg     Pulse Rate 01/23/15 0102 69     Resp 01/23/15 0102 16     Temp 01/23/15 0102 97.6 F (36.4 C)     Temp Source 01/23/15 0102 Oral     SpO2 01/23/15 0102 100 %     Weight 01/23/15 0102 132 lb (59.875 kg)     Height 01/23/15 0102 5\' 7"  (1.702 m)     Head Cir --      Peak Flow --      Pain Score 01/23/15 0103 7     Pain Loc --      Pain Edu? --  Excl. in GC? --     Constitutional: Alert and oriented. Well appearing and in no acute distress. Eyes: Conjunctivae are normal on right. PERRL. EOMI. normal right eye exam including cornea. The left eye demonstrates a clear cornea without uptake on fluorescein exam and there is no evidence of abrasion or ulceration under Wood's lamp. However, the left eye has a large subconjunctival hemorrhage in the medial aspect with some swelling without uptake. I discussed this with ophthalmology will see patient in consult this morning in the hospital. (d/w Dr. Marcy Panning) Head: Atraumatic. Nose: No congestion/rhinnorhea. Mouth/Throat: Mucous membranes are moist.  Oropharynx non-erythematous. Neck: No stridor.   Cardiovascular: Normal rate, regular rhythm. Grossly normal heart sounds.  Good peripheral circulation. Somewhat barrel chested appearance. Respiratory: Normal respiratory effort.  No retractions. Lungs  CTAB. Gastrointestinal: Soft and nontender. No distention. No abdominal bruits. No CVA tenderness. Musculoskeletal: No lower extremity tenderness nor edema.  No joint effusions. Neurologic:  Normal speech and language. No gross focal neurologic deficits are appreciated. No gait instability. Skin:  Skin is warm, dry and intact. No rash noted. Psychiatric: Mood and affect are normal. Speech and behavior are normal.  ____________________________________________   LABS (all labs ordered are listed, but only abnormal results are displayed)  Labs Reviewed  PROTIME-INR - Abnormal; Notable for the following:    Prothrombin Time 25.1 (*)    All other components within normal limits  CBC - Abnormal; Notable for the following:    RBC 2.49 (*)    Hemoglobin 9.0 (*)    HCT 26.8 (*)    MCV 107.4 (*)    MCH 36.3 (*)    All other components within normal limits  BASIC METABOLIC PANEL - Abnormal; Notable for the following:    CO2 18 (*)    BUN 59 (*)    Creatinine, Ser 4.55 (*)    Calcium 8.8 (*)    GFR calc non Af Amer 13 (*)    GFR calc Af Amer 16 (*)    All other components within normal limits  URINALYSIS COMPLETEWITH MICROSCOPIC ([facility] ONLY)   ____________________________________________  EKG  ED ECG REPORT I, QUALE, MARK, the attending physician, personally viewed and interpreted this ECG.  Date: 01/23/2015 EKG Time: 225 Rate: 80 Rhythm: normal sinus rhythm QRS Axis: normal Intervals: normal ST/T Wave abnormalities: normal Conduction Disutrbances: none Narrative Interpretation: unremarkable  ____________________________________________   EKG sinus rhythm with occasional supraventricular complex Ventricular rate 80 PR 126 QRS 96 QTc 456 Sinus with frequent supraventricular complex  RADIOLOGY   ____________________________________________   PROCEDURES  Procedure(s) performed: None  Critical Care performed:  No  ____________________________________________   INITIAL IMPRESSION / ASSESSMENT AND PLAN / ED COURSE  Pertinent labs & imaging results that were available during my care of the patient were reviewed by me and considered in my medical decision making (see chart for details).  Discussed ocular exam with Dr.Dr. Marcy Panning of ophthalmology. This appears consistent with a severe subconjunctival hemorrhage and the patient is on Coumadin. 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 I suspect he likely has a subconjunctival hemorrhage because of being on Coumadin, but because of the extent of hemorrhage Dr.Dr. Marcy Panning will see the patient in consult this morning as an inpatient.  I discussed with  Dr. Berneta Sages of nephrology the patient's laboratory tests, and based on no history of known end-stage renal disease but no history of only some mild to moderate CKD we will admit the patient for further workup as this appears to be significant change from his baseline as he approaches end-stage renal disease. ____________________________________________   FINAL CLINICAL IMPRESSION(S) / ED DIAGNOSES  Final diagnoses:  End stage renal disease  Acute kidney injury (nontraumatic)  Subconjunctival hemorrhage, left      Sharyn Creamer, MD 01/23/15 [phone]

## 2015-01-23 NOTE — ED Notes (Signed)
Pt presents to ED with c/o LEFT eye pain d/t a suspected foreign body. 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 Pt denies any immediate injury, but states he woke up in pain around midnight and noticed a blood clot in his left eye, at which point he called EMS to bring him to the ED for evaluation.

## 2015-01-24 LAB — IFE AND PE, RANDOM URINE
% BETA, Urine: 12.3 %
ALBUMIN, U: 64.8 %
ALPHA 1 URINE: 1.6 %
Alpha 2, Urine: 7.2 %
GAMMA GLOBULIN URINE: 14.2 %
Total Protein, Urine: 88.8 mg/dL

## 2015-01-24 LAB — IRON AND TIBC
IRON: 113 ug/dL (ref 45–182)
SATURATION RATIOS: 46 % — AB (ref 17.9–39.5)
TIBC: 247 ug/dL — AB (ref 250–450)
UIBC: 134 ug/dL

## 2015-01-24 LAB — ANCA TITERS
C-ANCA: <1:20 {titer}
P-ANCA: <1:20 {titer}

## 2015-01-24 LAB — RENAL FUNCTION PANEL
ALBUMIN: 3.4 g/dL — AB (ref 3.5–5.0)
ANION GAP: 6 (ref 5–15)
BUN: 39 mg/dL — AB (ref 6–20)
CALCIUM: 8.6 mg/dL — AB (ref 8.9–10.3)
CHLORIDE: 111 mmol/L (ref 101–111)
CO2: 21 mmol/L — ABNORMAL LOW (ref 22–32)
CREATININE: 2.02 mg/dL — AB (ref 0.61–1.24)
GFR calc Af Amer: 42 mL/min — ABNORMAL LOW (ref >60)
GFR, EST NON AFRICAN AMERICAN: 36 mL/min — AB (ref >60)
Glucose, Bld: 96 mg/dL (ref 65–99)
PHOSPHORUS: 3.5 mg/dL (ref 2.5–4.6)
POTASSIUM: 4.8 mmol/L (ref 3.5–5.1)
Sodium: 138 mmol/L (ref 135–145)

## 2015-01-24 LAB — CBC
HEMATOCRIT: 24.9 % — AB (ref 40.0–52.0)
HEMOGLOBIN: 8.4 g/dL — AB (ref 13.0–18.0)
MCH: 36.6 pg — ABNORMAL HIGH (ref 26.0–34.0)
MCHC: 33.7 g/dL (ref 32.0–36.0)
MCV: 108.6 fL — ABNORMAL HIGH (ref 80.0–100.0)
PLATELETS: 208 10*3/uL (ref 150–440)
RBC: 2.29 MIL/uL — ABNORMAL LOW (ref 4.40–5.90)
RDW: 13.3 % (ref 11.5–14.5)
WBC: 4.8 10*3/uL (ref 3.8–10.6)

## 2015-01-24 LAB — PROTEIN ELECTROPHORESIS, SERUM
A/G Ratio: 1.3 (ref 0.7–1.7)
ALPHA-1-GLOBULIN: 0.2 g/dL (ref 0.0–0.4)
Albumin ELP: 3.5 g/dL (ref 2.9–4.4)
Alpha-2-Globulin: 0.8 g/dL (ref 0.4–1.0)
Beta Globulin: 1 g/dL (ref 0.7–1.3)
GAMMA GLOBULIN: 0.8 g/dL (ref 0.4–1.8)
GLOBULIN, TOTAL: 2.8 g/dL (ref 2.2–3.9)
TOTAL PROTEIN ELP: 6.3 g/dL (ref 6.0–8.5)

## 2015-01-24 LAB — PARATHYROID HORMONE, INTACT (NO CA): PTH: 77 pg/mL — AB (ref 15–65)

## 2015-01-24 LAB — HEPATITIS C ANTIBODY

## 2015-01-24 LAB — FERRITIN: Ferritin: 105 ng/mL (ref 24–336)

## 2015-01-24 LAB — GLOMERULAR BASEMENT MEMBRANE ANTIBODIES: GBM AB: 3 U (ref 0–20)

## 2015-01-24 LAB — HEPATITIS B CORE ANTIBODY, IGM: HEP B C IGM: NEGATIVE

## 2015-01-24 LAB — KAPPA/LAMBDA LIGHT CHAINS
Kappa free light chain: 69.31 mg/L — ABNORMAL HIGH (ref 3.30–19.40)
Kappa, lambda light chain ratio: 1.55 (ref 0.26–1.65)
LAMDA FREE LIGHT CHAINS: 44.75 mg/L — AB (ref 5.71–26.30)

## 2015-01-24 LAB — HEPATITIS B SURFACE ANTIBODY,QUALITATIVE: Hep B S Ab: NONREACTIVE

## 2015-01-24 LAB — C3 COMPLEMENT: C3 Complement: 82 mg/dL (ref 82–167)

## 2015-01-24 LAB — MPO/PR-3 (ANCA) ANTIBODIES
ANCA Proteinase 3: <3.5 U/mL (ref 0.0–3.5)
Myeloperoxidase Abs: <9 U/mL (ref 0.0–9.0)

## 2015-01-24 LAB — ANA W/REFLEX: Anti Nuclear Antibody(ANA): NEGATIVE

## 2015-01-24 LAB — HIV ANTIBODY (ROUTINE TESTING W REFLEX): HIV Screen 4th Generation wRfx: NONREACTIVE

## 2015-01-24 LAB — C4 COMPLEMENT: Complement C4, Body Fluid: 16 mg/dL (ref 14–44)

## 2015-01-24 LAB — HEPATITIS B SURFACE ANTIGEN: Hepatitis B Surface Ag: NEGATIVE

## 2015-01-24 MED ORDER — DOCUSATE SODIUM 100 MG PO CAPS: 100.0000 mg | ORAL_CAPSULE | Freq: Two times a day (BID) | ORAL | Status: AC | PRN

## 2015-01-24 MED ORDER — DOCUSATE SODIUM 100 MG PO CAPS
100.0000 mg | ORAL_CAPSULE | Freq: Two times a day (BID) | ORAL | Status: DC
  Administered 2015-01-24: 09:00:00 100 mg via ORAL
  Filled 2015-01-24: qty 1

## 2015-01-24 MED ORDER — TAMSULOSIN HCL 0.4 MG PO CAPS: 0.4000 mg | ORAL_CAPSULE | Freq: Every day | ORAL | Status: AC

## 2015-01-24 MED ORDER — MORPHINE SULFATE 15 MG PO TABS: 15.0000 mg | ORAL_TABLET | Freq: Four times a day (QID) | ORAL | Status: AC | PRN

## 2015-01-24 MED ORDER — PANTOPRAZOLE SODIUM 40 MG PO TBEC
40.0000 mg | DELAYED_RELEASE_TABLET | Freq: Every day | ORAL | Status: DC
  Administered 2015-01-24: 09:00:00 40 mg via ORAL
  Filled 2015-01-24: qty 1

## 2015-01-24 MED ORDER — ARTIFICIAL TEARS OP OINT: TOPICAL_OINTMENT | Freq: Two times a day (BID) | OPHTHALMIC | Status: AC

## 2015-01-24 NOTE — Progress Notes (Signed)
[facility]  ROUNDING NOTE   Subjective:   Patient states he is feeling better. Off IV fluids. Creatinine down to 2.02.   Objective:  Vital signs in last 24 hours:  Temp:  [97.5 F (36.4 C)-98.4 F (36.9 C)] 98.1 F (36.7 C) (07/28 0456) Pulse Rate:  [59-71] 71 (07/28 0456) Resp:  [18-20] 18 (07/28 0456) BP: (126-151)/(71-74) 126/71 mmHg (07/28 0456) SpO2:  [98 %-100 %] 98 % (07/28 0456) Weight:  [57.38 kg (126 lb 8 oz)] 57.38 kg (126 lb 8 oz) (07/28 0500)  Weight change: -2.495 kg (-5 lb 8 oz) Filed Weights   01/23/15 0102 01/23/15 0411 01/24/15 0500  Weight: 59.875 kg (132 lb) 54.84 kg (120 lb 14.4 oz) 57.38 kg (126 lb 8 oz)    Intake/Output: I/O last 3 completed shifts: In: 360 [P.O.:360] Out: 800 [Urine:800]   Intake/Output this shift:  Total I/O In: 1063 [I.V.:1063] Out: 300 [Urine:300]  Physical Exam: General: NAD  Head: Normocephalic, atraumatic. Moist oral mucosal membranes  Eyes: Anicteric, PERRL, left conjunctival hemorrhage  Neck: Supple, trachea midline  Lungs:  Clear to auscultation  Heart: Regular rate and rhythm  Abdomen:  Soft, nontender,   Extremities:  peripheral edema.  Neurologic: Nonfocal, moving all four extremities  Skin: No lesions       Basic Metabolic Panel:  Recent Labs Lab 01/23/15 0122 01/23/15 1324 01/24/15 0741  NA 135 136 138  K 4.6 4.8 4.8  CL 107 108 111  CO2 18* 21* 21*  GLUCOSE 99 165* 96  BUN 59* 51* 39*  CREATININE 4.55* 3.24* 2.02*  CALCIUM 8.8* 8.8* 8.6*  PHOS  --  4.9* 3.5    Liver Function Tests:  Recent Labs Lab 01/23/15 0122 01/23/15 1324 01/24/15 0741  AST 19  --   --   ALT 12*  --   --   ALKPHOS 54  --   --   BILITOT 0.3  --   --   PROT 7.5  --   --   ALBUMIN 4.1 3.8 3.4*   No results for input(s): LIPASE, AMYLASE in the last 168 hours. No results for input(s): AMMONIA in the last 168 hours.  CBC:  Recent Labs Lab 01/23/15 0122 01/24/15 0741  WBC 5.7 4.8  HGB 9.0*  8.4*  HCT 26.8* 24.9*  MCV 107.4* 108.6*  PLT 230 208    Cardiac Enzymes: No results for input(s): CKTOTAL, CKMB, CKMBINDEX, TROPONINI in the last 168 hours.  BNP: Invalid input(s): POCBNP  CBG: No results for input(s): GLUCAP in the last 168 hours.  Microbiology: No results found for this or any previous visit.  Coagulation Studies:  Recent Labs  01/23/15 0122  LABPROT 25.1*  INR 2.26    Urinalysis:  Recent Labs  01/23/15 0337  COLORURINE YELLOW*  LABSPEC 1.013  PHURINE 5.0  GLUCOSEU NEGATIVE  HGBUR 1+*  BILIRUBINUR NEGATIVE  KETONESUR NEGATIVE  PROTEINUR 100*  NITRITE NEGATIVE  LEUKOCYTESUR NEGATIVE      Imaging: US Renal  01/23/2015   CLINICAL DATA:  Acute kidney failure  EXAM: RENAL / URINARY TRACT ULTRASOUND COMPLETE  COMPARISON:  03/16/2013  FINDINGS: Right Kidney:  Length: 10.1 cm. Echogenicity within normal limits. No mass or hydronephrosis visualized. Mild cortical thinning. Upper pole simple cyst measures 3.6 x 3.1 x 2.8 cm and previously measured 3.6 x 3.1 x 2.7 cm. Adjacent cyst measures 1.2 x 1.1 x 0.9 cm and previously measured 0.9 x 1.0 x 1.0 cm. These are simple cysts.  Left  Kidney:  Length: 10.6 cm. Echogenicity within normal limits. No mass or hydronephrosis visualized. Mild cortical thinning. Lateral mid kidney cyst measures 1.5 x 1.6 x 1.7 cm. Previously, this measured 1.6 x 1.5 x 1.5 cm.  Bladder:  Appears normal for degree of bladder distention.  IMPRESSION: No acute urinary pathology.  Stable simple cysts bilaterally.   Electronically Signed   By: Jolaine Click M.D.   On: 01/23/2015 09:40     Medications:     . docusate sodium  100 mg Oral BID  . pantoprazole  40 mg Oral Daily  . tamsulosin  0.4 mg Oral Daily   acetaminophen **OR** acetaminophen, artificial tears, morphine injection, nicotine, ondansetron **OR** ondansetron (ZOFRAN) IV  Assessment/ Plan:  Mr. Joseph Kramer is a 54 y.o. white male with membranous glomerulopathy  biopsy proven with history of nephrotic syndrome, DVT on warfarin, hypertension who was admitted to [facility] on 01/23/2015 for Acute-on-chronic kidney injury.   1. Acute Renal Failure on Chronic Kidney Disease stage III secondary to Membranous Glomerulopathy with hematuria and proteinuria. Acute renal failure seems to be prerenal as this has improved significantly with IV fluids. He reports working in 100 degree weather and not drinking appropriately.   - At this time, full vasculitis work up has been sent. However most likely this is due to progression of membranous gn.  - Will need outpatient nephrology follow up.   2. Hypertension: well controlled. Currently not on any agents. History of lisinopril.   3. Secondary Hyperparathyroidism: PTH 77. At goal for stage IV CKD. Phos and Calcium at goal.   4. Anemia of chronic kidney disease: hemoglobin of 8.4 with elevated MCV. This is more consistent with alcohol consumption.    LOS: 1 Joseph Kramer 7/28/20168:35 AM

## 2015-01-24 NOTE — Discharge Instructions (Signed)
Acute Kidney Injury Acute kidney injury is a disease in which there is sudden (acute) damage to the kidneys. The kidneys are 2 organs that lie on either side of the spine between the middle of the back and the front of the abdomen. The kidneys:  Remove wastes and extra water from the blood.   Produce important hormones. These help keep bones strong, regulate blood pressure, and help create red blood cells.   Balance the fluids and chemicals in the blood and tissues. A small amount of kidney damage may not cause problems, but a large amount of damage may make it difficult or impossible for the kidneys to work the way they should. Acute kidney injury may develop into long-lasting (chronic) kidney disease. It may also develop into a life-threatening disease called end-stage kidney disease. Acute kidney injury can get worse very quickly, so it should be treated right away. Early treatment may prevent other kidney diseases from developing.  CAUSES   A problem with blood flow to the kidneys. This may be caused by:   Blood loss.   Heart disease.   Severe burns.   Liver disease.  Direct damage to the kidneys. This may be caused by:  Some medicines.   A kidney infection.   Poisoning or consuming toxic substances.   A surgical wound.   A blow to the kidney area.   A problem with urine flow. This may be caused by:   Cancer.   Kidney stones.   An enlarged prostate. SYMPTOMS   Swelling (edema) of the legs, ankles, or feet.   Tiredness (lethargy).   Nausea or vomiting.   Confusion.   Problems with urination, such as:   Painful or burning feeling during urination.   Decreased urine production.   Frequent accidents in children who are potty trained.   Bloody urine.   Muscle twitches and cramps.   Shortness of breath.   Seizures.   Chest pain or pressure. Sometimes, no symptoms are present. DIAGNOSIS Acute kidney injury may be detected  and diagnosed by tests, including blood, urine, imaging, or kidney biopsy tests.  TREATMENT Treatment of acute kidney injury varies depending on the cause and severity of the kidney damage. In mild cases, no treatment may be needed. The kidneys may heal on their own. If acute kidney injury is more severe, your caregiver will treat the cause of the kidney damage, help the kidneys heal, and prevent complications from occurring. Severe cases may require a procedure to remove toxic wastes from the body (dialysis) or surgery to repair kidney damage. Surgery may involve:   Repair of a torn kidney.   Removal of an obstruction. Most of the time, you will need to stay overnight at the hospital.  HOME CARE INSTRUCTIONS:  Follow your prescribed diet.  Only take over-the-counter or prescription medicines as directed by your caregiver.  Do not take any new medicines (prescription, over-the-counter, or nutritional supplements) unless approved by your caregiver. Many medicines can worsen your kidney damage or need to have the dose adjusted.   Keep all follow-up appointments as directed by your caregiver.  Observe your condition to make sure you are healing as expected. SEEK IMMEDIATE MEDICAL CARE IF:  You are feeling ill or have severe pain in the back or side.   Your symptoms return or you have new symptoms.  You have any symptoms of end-stage kidney disease. These include:   Persistent itchiness.   Loss of appetite.   Headaches.   Abnormally dark  or light skin.  Numbness in the hands or feet.   Easy bruising.   Frequent hiccups.   Menstruation stops.   You have a fever.  You have increased urine production.  You have pain or bleeding when urinating. MAKE SURE YOU:   Understand these instructions.  Will watch your condition.  Will get help right away if you are not doing well or get worse Document Released: 12/29/2010 Document Revised: 10/10/2012 Document  Reviewed: 02/12/2012 [facility] Patient Information 2015 [place], [place]. This information is not intended to replace advice given to you by your health care provider. Make sure you discuss any questions you have with your health care provider.  Subconjunctival Hemorrhage Your exam shows you have a subconjunctival hemorrhage. This is a harmless collection of blood covering a portion of the white of the eye. This condition may be due to injury or to straining (lifting, sneezing, or coughing). Often, there is no known cause. Subconjunctival blood does not cause pain or vision problems. This condition needs no treatment. It will take 1 to 2 weeks for the blood to dissolve. If you take aspirin or Coumadin on a daily basis or if you have high blood pressure, you should check with your doctor about the need for further treatment. Please call your doctor if you have problems with your vision, pain around the eye, or any other concerns about your condition. Document Released: 07/23/2004 Document Revised: 09/07/2011 Document Reviewed: 05/13/2009 [facility] Patient Information 2015 [place], [place]. This information is not intended to replace advice given to you by your health care provider. Make sure you discuss any questions you have with your health care provider. Chronic Kidney Disease Chronic kidney disease occurs when the kidneys are damaged over a long period. The kidneys are two organs that lie on either side of the spine between the middle of the back and the front of the abdomen. The kidneys:   Remove wastes and extra water from the blood.   Produce important hormones. These help keep bones strong, regulate blood pressure, and help create red blood cells.   Balance the fluids and chemicals in the blood and tissues. A small amount of kidney damage may not cause problems, but a large amount of damage may make it difficult or impossible for the kidneys to work the way they should. If steps are not taken to  slow down the kidney damage or stop it from getting worse, the kidneys may stop working permanently. Most of the time, chronic kidney disease does not go away. However, it can often be controlled, and those with the disease can usually live normal lives. CAUSES  The most common causes of chronic kidney disease are diabetes and high blood pressure (hypertension). Chronic kidney disease may also be caused by:   Diseases that cause the kidneys' filters to become inflamed.   Diseases that affect the immune system.   Genetic diseases.   Medicines that damage the kidneys, such as anti-inflammatory medicines.  Poisoning or exposure to toxic substances.   A reoccurring kidney or urinary infection.   A problem with urine flow. This may be caused by:   Cancer.   Kidney stones.   An enlarged prostate in males. SIGNS AND SYMPTOMS  Because the kidney damage in chronic kidney disease occurs slowly, symptoms develop slowly and may not be obvious until the kidney damage becomes severe. A person may have a kidney disease for years without showing any symptoms. Symptoms can include:   Swelling (edema) of the legs,  ankles, or feet.   Tiredness (lethargy).   Nausea or vomiting.   Confusion.   Problems with urination, such as:   Decreased urine production.   Frequent urination, especially at night.   Frequent accidents in children who are potty trained.   Muscle twitches and cramps.   Shortness of breath.  Weakness.   Persistent itchiness.   Loss of appetite.  Metallic taste in the mouth.  Trouble sleeping.  Slowed development in children.  Short stature in children. DIAGNOSIS  Chronic kidney disease may be detected and diagnosed by tests, including blood, urine, imaging, or kidney biopsy tests.  TREATMENT  Most chronic kidney diseases cannot be cured. Treatment usually involves relieving symptoms and preventing or slowing the progression of the disease.  Treatment may include:   A special diet. You may need to avoid alcohol and foods thatare salty and high in potassium.   Medicines. These may:   Lower blood pressure.   Relieve anemia.   Relieve swelling.   Protect the bones. HOME CARE INSTRUCTIONS   Follow your prescribed diet.   Take medicines only as directed by your health care provider. Do not take any new medicines (prescription, over-the-counter, or nutritional supplements) unless approved by your health care provider. Many medicines can worsen your kidney damage or need to have the dose adjusted.   Quit smoking if you smoke. Talk to your health care provider about a smoking cessation program.   Keep all follow-up visits as directed by your health care provider. SEEK IMMEDIATE MEDICAL CARE IF:  Your symptoms get worse or you develop new symptoms.   You develop symptoms of end-stage kidney disease. These include:   Headaches.   Abnormally dark or light skin.   Numbness in the hands or feet.   Easy bruising.   Frequent hiccups.   Menstruation stops.   You have a fever.   You have decreased urine production.   You havepain or bleeding when urinating. MAKE SURE YOU:  Understand these instructions.  Will watch your condition.  Will get help right away if you are not doing well or get worse. FOR MORE INFORMATION   American Association of Kidney Patients: ResidentialShow.is  National Kidney Foundation: www.kidney.org  American Kidney Fund: FightingMatch.com.ee  [facility]: www.lifeoptions.org and www.kidneyschool.org Document Released: 03/24/2008 Document Revised: 10/30/2013 Document Reviewed: 02/12/2012 [facility] Patient Information 2015 [place], [place]. This information is not intended to replace advice given to you by your health care provider. Make sure you discuss any questions you have with your health care provider.

## 2015-01-24 NOTE — Progress Notes (Signed)
Dr Elisabeth Pigeon made aware that pt requesting protonix states he take this med at home, also requesting stool softner, new order for colace  PO BID, and protonix  PO daily

## 2015-01-24 NOTE — Discharge Summary (Signed)
[facility] - [place] at [facility]   PATIENT NAME: Joseph Kramer    MR#:  [id number]  DATE OF BIRTH:  1961-05-15  DATE OF ADMISSION:  01/23/2015 ADMITTING PHYSICIAN: Crissie Figures, MD  DATE OF DISCHARGE: 01/24/2015  PRIMARY CARE PHYSICIAN: No primary care provider on file.    ADMISSION DIAGNOSIS:  End stage renal disease [N18.6] Acute kidney injury (nontraumatic) [N17.9] Subconjunctival hemorrhage, left [H11.32]  DISCHARGE DIAGNOSIS:  Principal Problem:   Acute-on-chronic kidney injury Active Problems:   Subconjunctival hemorrhage of left eye   HTN (hypertension)   Chronic anticoagulation   Anemia due to chronic kidney disease   Anemia in chronic kidney disease (CKD)   SECONDARY DIAGNOSIS:   Past Medical History  Diagnosis Date  . Hypertension   . COPD (chronic obstructive pulmonary disease)   . Renal disorder   . Anemia   . PE (pulmonary embolism)     HOSPITAL COURSE:   * Ac on ch kidney failure  He has c/o urinary frequency, hasistancy, and weak stream for last few months and has to wake up every 1-2 hrs in night. Likely it is A prostate problem.   start flomax, US renal is done- no obstructions- and may send him to urologist as out pt.  Nephrology consult is appreciated.  Follow renal func has good improvement, avoid nphrotoxins.  * Left side sub conjunctival hemorrhage  Pain in same eye. Vision not changed.  Ophthalmology consult is called in morning.  I called again to speak to ophthalmologist - as per them this hemorhage will have spontaneous resolution, no need to change anticoagulation.  He is on coumadin.  * Hypertension, stable.hold lisinopril, hold his HCTZ for now.   BP stable without any meds.  * History of pulmonary embolism/DVT, patient on Coumadin, INR therapeutic.   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 Patient stable clinically. Monitor CBC.  DISCHARGE CONDITIONS:   Stable.  CONSULTS OBTAINED:  Treatment Team:  Lia Hopping, MD Lamont Dowdy, MD  DRUG ALLERGIES:   Allergies  Allergen Reactions  . Codeine Rash    DISCHARGE MEDICATIONS:   Current Discharge Medication List    START taking these medications   Details  artificial tears (LACRILUBE) OINT ophthalmic ointment Place into both eyes 2 (two) times daily. Qty: 1 Tube, Refills: 0    docusate sodium (COLACE) 100 MG capsule Take 1 capsule (100 mg total) by mouth 2 (two) times daily as needed for mild constipation. Qty: 10 capsule, Refills: 0    morphine (MSIR) 15 MG tablet Take 1 tablet (15 mg total) by mouth every 6 (six) hours as needed for severe pain. Qty: 15 tablet, Refills: 0    tamsulosin (FLOMAX) 0.4 MG CAPS capsule Take 1 capsule (0.4 mg total) by mouth daily. Qty: 30 capsule, Refills: 0      CONTINUE these medications which have NOT CHANGED   Details  warfarin (COUMADIN) 5 MG tablet Take 5 mg by mouth daily.      STOP taking these medications     hydrochlorothiazide (MICROZIDE) 12.5 MG capsule      lisinopril (PRINIVIL,ZESTRIL) 10 MG tablet          DISCHARGE INSTRUCTIONS:    Follow with Ophthalmology and nephrology clinics in  1-2 weeks.  If you experience worsening of your admission symptoms, develop shortness of breath, life threatening emergency, suicidal or homicidal thoughts you must seek medical attention immediately by calling 911 or calling your MD immediately  if symptoms less severe.  You Must read complete instructions/literature along with all the possible adverse reactions/side effects for all the Medicines you take and that have been prescribed to you. Take any new Medicines after you have completely understood and accept all the possible adverse reactions/side effects.    Please note  You were cared for by a hospitalist during your hospital stay. If you have any questions about your discharge medications or the care you received while you were in the hospital after you are discharged, you can call the unit and asked to speak with the hospitalist on call if the hospitalist that took care of you is not available. Once you are discharged, your primary care physician will handle any further medical issues. Please note that NO REFILLS for any discharge medications will be authorized once you are discharged, as it is imperative that you return to your primary care physician (or establish a relationship with a primary care physician if you do not have one) for your aftercare needs so that they can reassess your need for medications and monitor your lab values.    Today   CHIEF COMPLAINT:   Chief Complaint  Patient presents with  . Foreign Body in Eye    HISTORY OF PRESENT ILLNESS:  Joseph Kramer  is a 54 y.o. male with a known history of hypertension, CK D stage III, history of pulmonary embolism/DVT on Coumadin, anemia of chronic kidney disease presents to the emergency room with the complaints of redness and pain/discomfort left eye noticed around 10 PM last night. Patient states he went to bed last night normally and when he woke up he noticed redness, swelling and pain of left eye, hence came to the emergency room for further evaluation. Denies any injury to the eye. Denies any visual impairment denies any recent URI symptoms or chronic cough. Denies any chest pain, shortness of breath, nausea, vomiting, diarrhea, abdominal pain, dysuria. Evaluation in the ED revealed left eye significant subconjunctival hemorrhage with no visual impairment and blood work revealed acute kidney injury on chronic kidney disease stage III. Hospitalist service was consulted for further evaluation and management. Patient is comfortably resting in the bed at this time and denies any  complaint except for discomfort in the left eye.   VITAL SIGNS:  Blood pressure 126/71, pulse 71, temperature 98.1 F (36.7 C), temperature source Oral, resp. rate 18, height 5\' 7"  (1.702 m), weight 57.38 kg (126 lb 8 oz), SpO2 98 %.  I/O:    Intake/Output Summary (Last 24 hours) at 01/24/15 0931 Last data filed at 01/24/15 0717  Gross per 24 hour  Intake   1303 ml  Output   1100 ml  Net    203 ml    PHYSICAL EXAMINATION:   GENERAL: 54 y.o.-year-old patient lying in the bed with no acute distress.  EYES: Pupils equal, round, reactive to light and accommodation. Left side- subconjunctival hemorrhage with bulging conjunctiva. Extraocular muscles intact.  HEENT: Head atraumatic, normocephalic. Oropharynx and nasopharynx clear.  NECK: Supple, no jugular venous distention. No thyroid enlargement, no tenderness.  LUNGS: Normal breath sounds bilaterally, no wheezing, rales,rhonchi or crepitation. No use of accessory muscles of respiration.  CARDIOVASCULAR: S1, S2 normal. No murmurs, rubs, or gallops.  ABDOMEN: Soft, nontender, nondistended. Bowel sounds  present. No organomegaly or mass.  EXTREMITIES: No pedal edema, cyanosis, or clubbing.  NEUROLOGIC: Cranial nerves II through XII are intact. Muscle strength 5/5 in all extremities. Sensation intact. Gait not checked.  PSYCHIATRIC: The patient is alert and oriented x 3.  SKIN: No obvious rash, lesion, or ulcer.   DATA REVIEW:   CBC  Recent Labs Lab 01/24/15 0741  WBC 4.8  HGB 8.4*  HCT 24.9*  PLT 208    Chemistries   Recent Labs Lab 01/23/15 0122  01/24/15 0741  NA 135  < > 138  K 4.6  < > 4.8  CL 107  < > 111  CO2 18*  < > 21*  GLUCOSE 99  < > 96  BUN 59*  < > 39*  CREATININE 4.55*  < > 2.02*  CALCIUM 8.8*  < > 8.6*  AST 19  --   --   ALT 12*  --   --   ALKPHOS 54  --   --   BILITOT 0.3  --   --   < > = values in this interval not displayed.  Cardiac Enzymes No results for input(s): TROPONINI in  the last 168 hours.  Microbiology Results  No results found for this or any previous visit.  RADIOLOGY:  US Renal  01/23/2015   CLINICAL DATA:  Acute kidney failure  EXAM: RENAL / URINARY TRACT ULTRASOUND COMPLETE  COMPARISON:  03/16/2013  FINDINGS: Right Kidney:  Length: 10.1 cm. Echogenicity within normal limits. No mass or hydronephrosis visualized. Mild cortical thinning. Upper pole simple cyst measures 3.6 x 3.1 x 2.8 cm and previously measured 3.6 x 3.1 x 2.7 cm. Adjacent cyst measures 1.2 x 1.1 x 0.9 cm and previously measured 0.9 x 1.0 x 1.0 cm. These are simple cysts.  Left Kidney:  Length: 10.6 cm. Echogenicity within normal limits. No mass or hydronephrosis visualized. Mild cortical thinning. Lateral mid kidney cyst measures 1.5 x 1.6 x 1.7 cm. Previously, this measured 1.6 x 1.5 x 1.5 cm.  Bladder:  Appears normal for degree of bladder distention.  IMPRESSION: No acute urinary pathology.  Stable simple cysts bilaterally.   Electronically Signed   By: Jolaine Click M.D.   On: 01/23/2015 09:40    Management plans discussed with the patient, family and they are in agreement.  CODE STATUS: Full    Code Status Orders        Start     Ordered   01/23/15 0355  Full code   Continuous     01/23/15 0354      TOTAL TIME TAKING CARE OF THIS PATIENT: 40 minutes.    Altamese Dilling M.D on 01/24/2015 at 9:31 AM  Between 7am to 6pm - Pager - [phone]  After 6pm go to [email or web address] - password [email or web address] [facility]  [facility]  Office  [phone]  CC: Primary care physician; No primary care provider on file.

## 2015-01-24 NOTE — Plan of Care (Signed)
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 Current everyday smoker but declined smoking cessation information   Drinks beer and shots of liquor daily. Monitor for s/sx of withdrawal. Goal: Other Discharge Outcomes/Goals Outcome: Progressing Plan of care progress to goal 1. Pain: pt with c/o left eye pain with headache. Pt receiving Morphine 2 mg q4 hrs for severe pain with effective relief. 2. Hemodynamically stable: Pt had renal US yesterday which showed bilateral renal cyst.  3. Nephrology cx completed. BUN/Cr still elevated. IVF infusing but ordered to stop this am.

## 2015-01-24 NOTE — Progress Notes (Signed)
Pt being discharged home at this time, discharge reviewed with pt, prescriptions reviewed with pt, states understanding, no noted complaints at discharge

## 2015-02-20 ENCOUNTER — Ambulatory Visit: Payer: Self-pay | Admitting: Internal Medicine

## 2015-02-20 DIAGNOSIS — I2699 Other pulmonary embolism without acute cor pulmonale: Secondary | ICD-10-CM | POA: Insufficient documentation

## 2015-04-03 ENCOUNTER — Ambulatory Visit: Payer: Self-pay

## 2015-05-01 ENCOUNTER — Ambulatory Visit: Payer: Self-pay

## 2015-05-15 ENCOUNTER — Other Ambulatory Visit: Payer: Self-pay

## 2015-05-15 LAB — HEPATIC FUNCTION PANEL
ALK PHOS: 67 U/L (ref 25–125)
ALT: 12 U/L (ref 10–40)
AST: 28 U/L (ref 14–40)
Bilirubin, Total: 0.3 mg/dL

## 2015-05-15 LAB — BASIC METABOLIC PANEL
BUN: 35 mg/dL — AB (ref 4–21)
CREATININE: 2 mg/dL — AB (ref 0.6–1.3)
Glucose: 119 mg/dL
Potassium: 5.1 mmol/L (ref 3.4–5.3)
Sodium: 137 mmol/L (ref 137–147)

## 2015-05-15 LAB — CBC AND DIFFERENTIAL
HCT: 31 % — AB (ref 41–53)
Hemoglobin: 10.5 g/dL — AB (ref 13.5–17.5)
Neutrophils Absolute: 3 /uL
Platelets: 246 K/µL (ref 150–399)
WBC: 5.4 10*3/mL

## 2015-05-22 ENCOUNTER — Other Ambulatory Visit: Payer: Self-pay

## 2015-07-24 ENCOUNTER — Ambulatory Visit: Payer: Self-pay | Admitting: Internal Medicine

## 2015-07-31 ENCOUNTER — Ambulatory Visit: Payer: Self-pay | Admitting: Internal Medicine

## 2015-08-07 ENCOUNTER — Ambulatory Visit: Payer: Self-pay

## 2015-08-07 LAB — POCT INR: INR: 1.4 — AB (ref 0.9–1.1)

## 2015-08-07 LAB — PROTIME-INR: Protime: 14.7 seconds — AB (ref 10.0–13.8)

## 2015-08-14 ENCOUNTER — Other Ambulatory Visit: Payer: Self-pay

## 2015-08-21 ENCOUNTER — Ambulatory Visit: Payer: Self-pay

## 2015-10-08 DIAGNOSIS — I2699 Other pulmonary embolism without acute cor pulmonale: Secondary | ICD-10-CM

## 2015-11-20 ENCOUNTER — Other Ambulatory Visit: Payer: Self-pay

## 2015-11-20 DIAGNOSIS — I10 Essential (primary) hypertension: Secondary | ICD-10-CM

## 2015-11-20 MED ORDER — HYDROCHLOROTHIAZIDE 25 MG PO TABS: 25.0000 mg | ORAL_TABLET | Freq: Every day | ORAL | Status: AC

## 2015-11-20 MED ORDER — LISINOPRIL 20 MG PO TABS: 20.0000 mg | ORAL_TABLET | Freq: Every day | ORAL | Status: AC

## 2015-11-20 NOTE — Telephone Encounter (Signed)
Request faxed over from [facility] to refill hydrochlorothiazide 25mg  and lisinopril 20mg .

## 2016-01-29 ENCOUNTER — Other Ambulatory Visit: Payer: Self-pay

## 2016-02-05 ENCOUNTER — Ambulatory Visit: Payer: Self-pay | Admitting: Internal Medicine

## 2017-01-27 DEATH — deceased

## 2017-03-29 IMAGING — US US RENAL
1 series · 14 of 25 positions shown · non-contrast
Comparison: 03/16/2013

CLINICAL DATA: Acute kidney failure

EXAM:
RENAL / URINARY TRACT ULTRASOUND COMPLETE

[Series 1: us renal · 0.21mm/px · 14 of 37 slices shown]
[im 1/37]
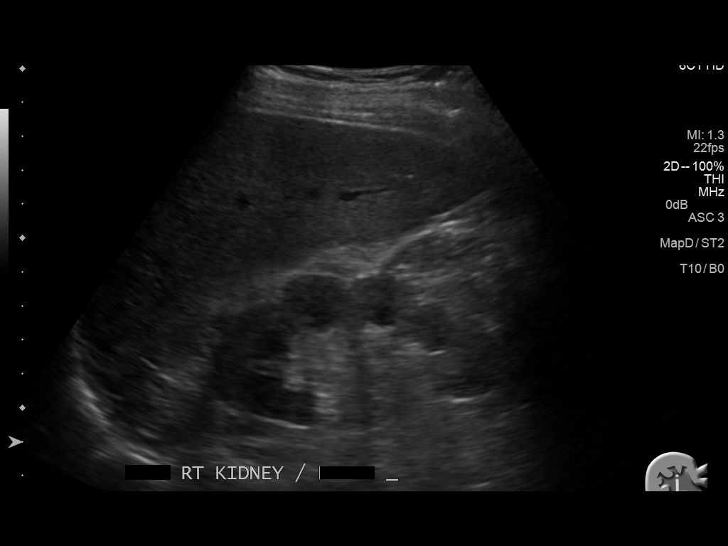
[im 4/37]
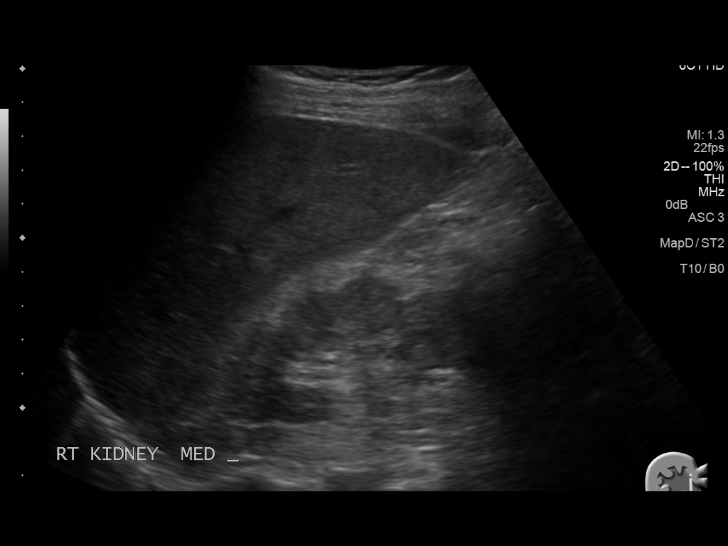
[im 7/37]
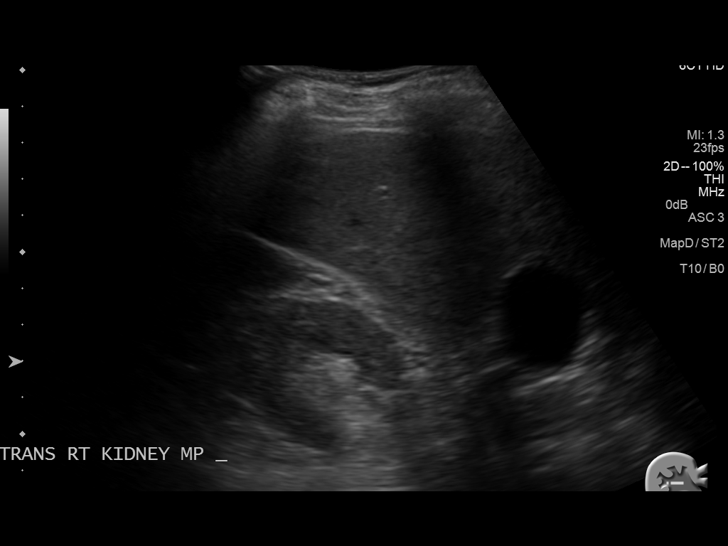
[im 10/37]
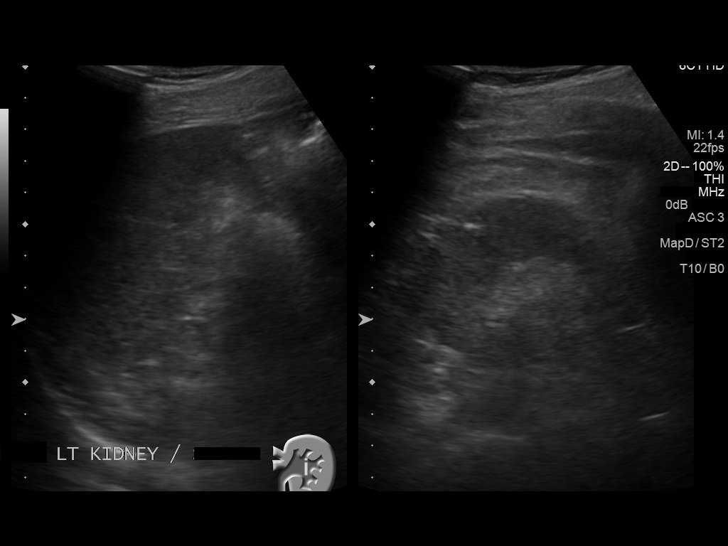
[im 13/37]
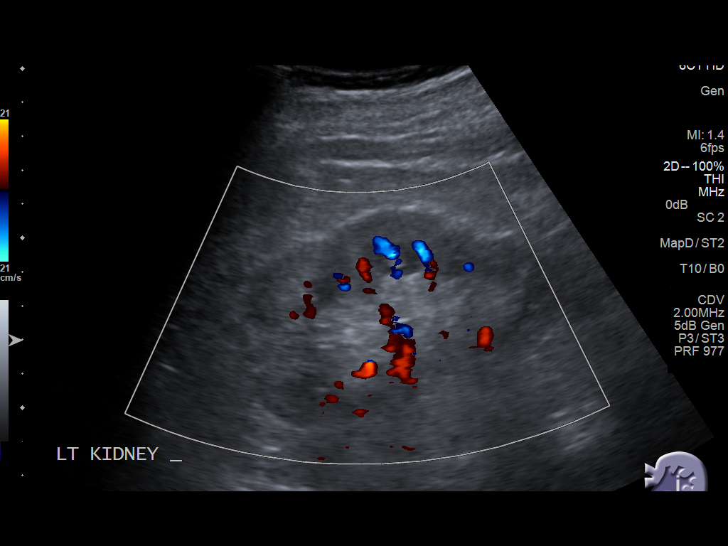
[im 14/37]
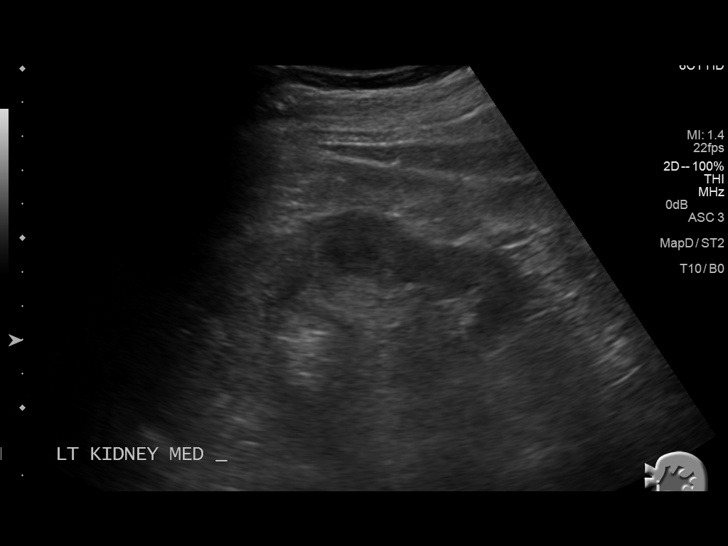
[im 17/37]
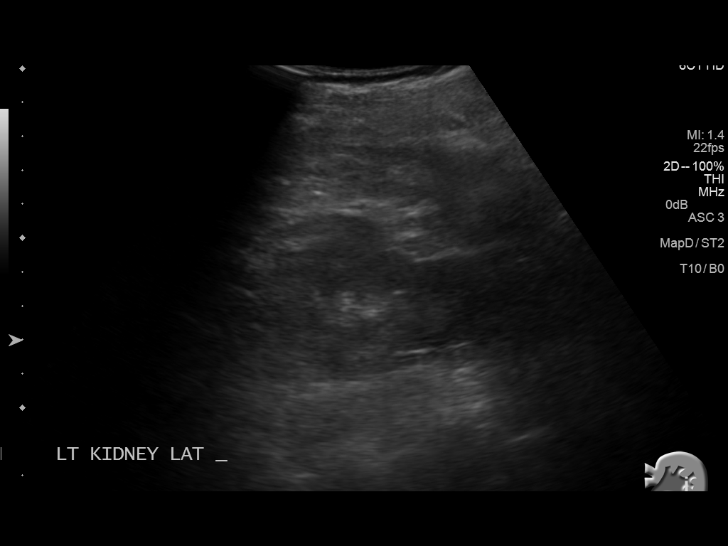
[im 20/37]
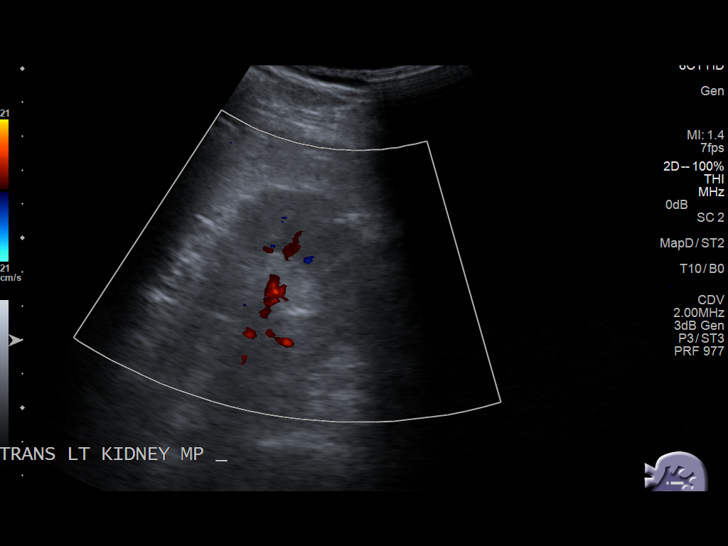
[im 23/37]
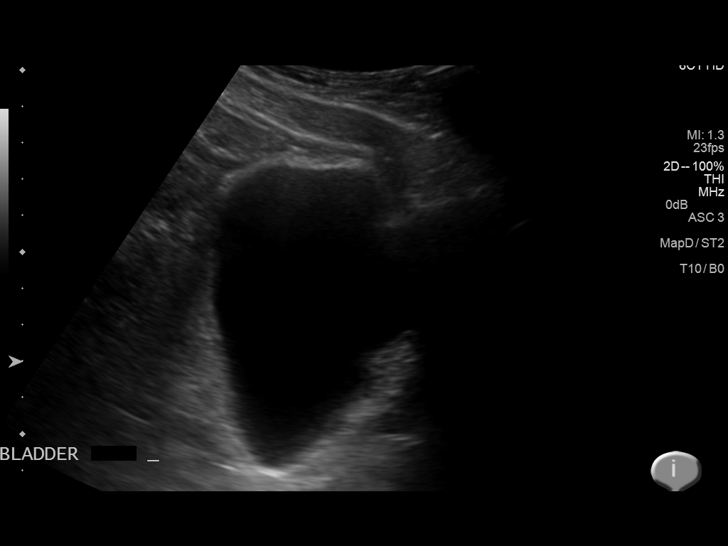
[im 25/37]
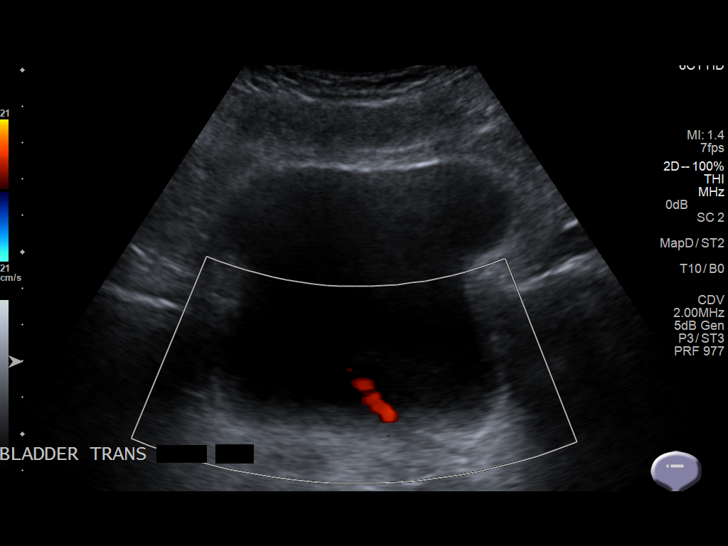
[im 28/37]
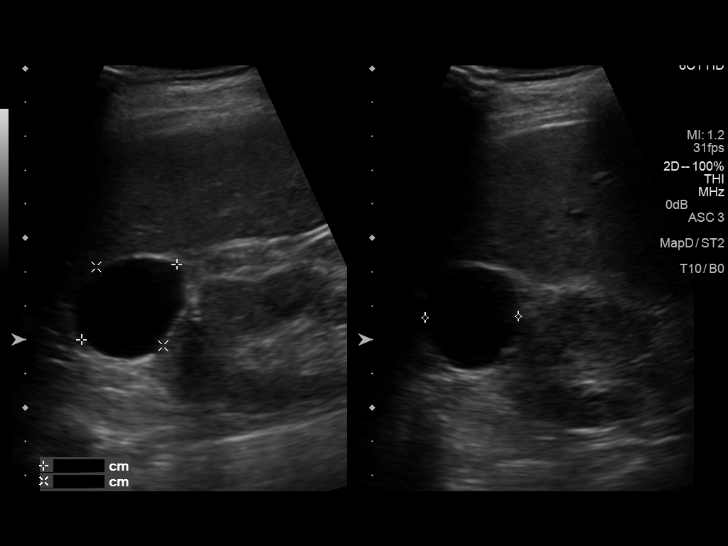
[im 31/37]
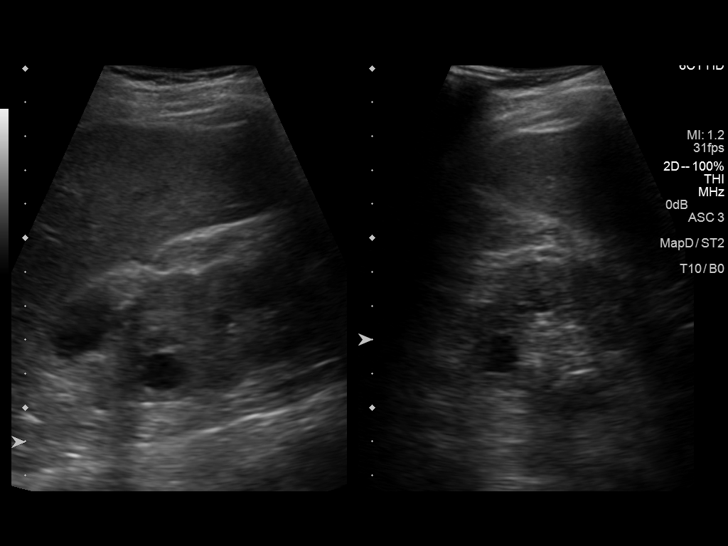
[im 34/37]
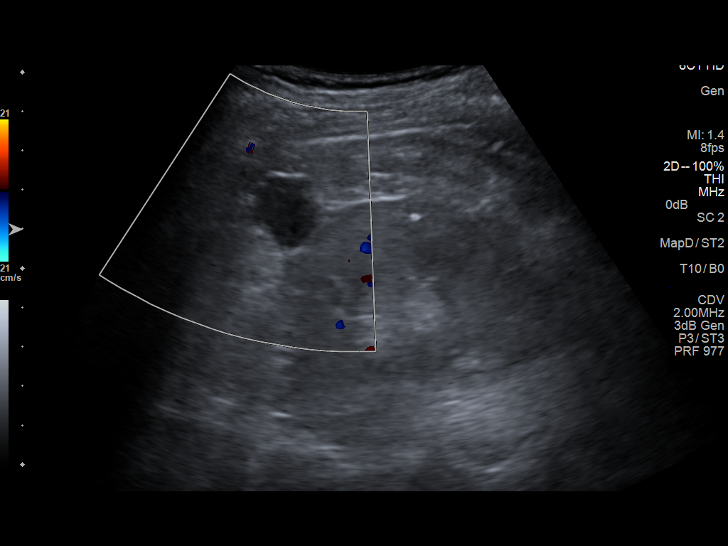
[im 37/37]
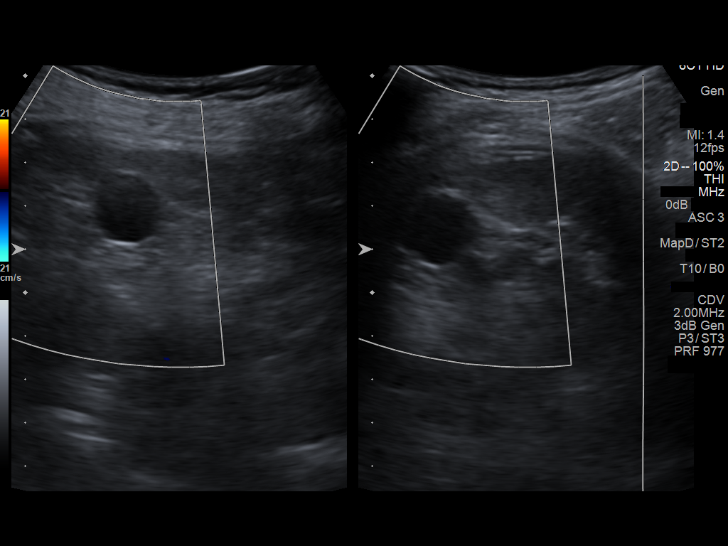

[14 of 25 positions shown; findings below may reference images not displayed]

FINDINGS: Right Kidney:

Length: 10.1 cm. Echogenicity within normal limits. No mass or
hydronephrosis visualized. Mild cortical thinning. Upper pole simple
cyst measures 3.6 x 3.1 x 2.8 cm and previously measured 3.6 x 3.1 x
2.7 cm. Adjacent cyst measures 1.2 x 1.1 x 0.9 cm and previously
measured 0.9 x 1.0 x 1.0 cm. These are simple cysts.

Left Kidney:

Length: 10.6 cm. Echogenicity within normal limits. No mass or
hydronephrosis visualized. Mild cortical thinning. Lateral mid
kidney cyst measures 1.5 x 1.6 x 1.7 cm. Previously, this measured
1.6 x 1.5 x 1.5 cm.

Bladder:

Appears normal for degree of bladder distention.
IMPRESSION: No acute urinary pathology.  Stable simple cysts bilaterally.
